# Patient Record
Sex: Male | Born: 1995 | Race: Black or African American | Hispanic: No | Marital: Single | Smoking: Current every day smoker
Health system: Southern US, Community
[De-identification: ages and names within clinical notes are randomized; demographics above are authoritative.]

## PROBLEM LIST (undated history)

## (undated) DIAGNOSIS — F1910 Other psychoactive substance abuse, uncomplicated: Secondary | ICD-10-CM

---
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
## 2015-03-15 ENCOUNTER — Encounter (HOSPITAL_COMMUNITY): Payer: Self-pay | Admitting: Emergency Medicine

## 2015-03-15 ENCOUNTER — Emergency Department (HOSPITAL_COMMUNITY)
Admission: EM | Admit: 2015-03-15 | Discharge: 2015-03-16 | Discharge status: Against Medical Advice | Payer: Medicaid Other | Attending: Emergency Medicine | Admitting: Emergency Medicine

## 2015-03-15 DIAGNOSIS — S0181XA Laceration without foreign body of other part of head, initial encounter: Secondary | ICD-10-CM | POA: Diagnosis not present

## 2015-03-15 DIAGNOSIS — Z720 Tobacco use: Secondary | ICD-10-CM | POA: Diagnosis not present

## 2015-03-15 DIAGNOSIS — X58XXXA Exposure to other specified factors, initial encounter: Secondary | ICD-10-CM | POA: Insufficient documentation

## 2015-03-15 DIAGNOSIS — S0990XA Unspecified injury of head, initial encounter: Secondary | ICD-10-CM | POA: Diagnosis present

## 2015-03-15 DIAGNOSIS — Y929 Unspecified place or not applicable: Secondary | ICD-10-CM | POA: Diagnosis not present

## 2015-03-15 DIAGNOSIS — Y999 Unspecified external cause status: Secondary | ICD-10-CM | POA: Insufficient documentation

## 2015-03-15 DIAGNOSIS — Y939 Activity, unspecified: Secondary | ICD-10-CM | POA: Diagnosis not present

## 2015-03-15 NOTE — ED Notes (Signed)
Pt seen standing in the hallway on phone. When this RN went into the room patient was no longer in the room.
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
## 2016-06-08 DIAGNOSIS — S0240CA Maxillary fracture, right side, initial encounter for closed fracture: Secondary | ICD-10-CM | POA: Diagnosis not present

## 2016-06-08 DIAGNOSIS — W228XXA Striking against or struck by other objects, initial encounter: Secondary | ICD-10-CM | POA: Insufficient documentation

## 2016-06-08 DIAGNOSIS — Y999 Unspecified external cause status: Secondary | ICD-10-CM | POA: Diagnosis not present

## 2016-06-08 DIAGNOSIS — Y939 Activity, unspecified: Secondary | ICD-10-CM | POA: Diagnosis not present

## 2016-06-08 DIAGNOSIS — Z23 Encounter for immunization: Secondary | ICD-10-CM | POA: Diagnosis not present

## 2016-06-08 DIAGNOSIS — S01511A Laceration without foreign body of lip, initial encounter: Secondary | ICD-10-CM | POA: Diagnosis not present

## 2016-06-08 DIAGNOSIS — Z79899 Other long term (current) drug therapy: Secondary | ICD-10-CM | POA: Diagnosis not present

## 2016-06-08 DIAGNOSIS — S0990XA Unspecified injury of head, initial encounter: Secondary | ICD-10-CM | POA: Diagnosis present

## 2016-06-08 DIAGNOSIS — Y929 Unspecified place or not applicable: Secondary | ICD-10-CM | POA: Insufficient documentation

## 2016-06-08 DIAGNOSIS — F17200 Nicotine dependence, unspecified, uncomplicated: Secondary | ICD-10-CM | POA: Insufficient documentation

## 2016-06-09 ENCOUNTER — Emergency Department (HOSPITAL_COMMUNITY): Payer: Medicaid Other

## 2016-06-09 ENCOUNTER — Encounter (HOSPITAL_COMMUNITY): Payer: Self-pay

## 2016-06-09 ENCOUNTER — Emergency Department (HOSPITAL_COMMUNITY)
Admission: EM | Admit: 2016-06-09 | Discharge: 2016-06-09 | Disposition: A | Discharge status: Home / Self Care | Payer: Medicaid Other | Attending: Emergency Medicine | Admitting: Emergency Medicine

## 2016-06-09 DIAGNOSIS — S0240CA Maxillary fracture, right side, initial encounter for closed fracture: Secondary | ICD-10-CM

## 2016-06-09 DIAGNOSIS — S01511A Laceration without foreign body of lip, initial encounter: Secondary | ICD-10-CM

## 2016-06-09 DIAGNOSIS — S0993XA Unspecified injury of face, initial encounter: Secondary | ICD-10-CM

## 2016-06-09 MED ORDER — TETANUS-DIPHTH-ACELL PERTUSSIS 5-2.5-18.5 LF-MCG/0.5 IM SUSP
0.5000 mL | Freq: Once | INTRAMUSCULAR | Status: AC
  Administered 2016-06-09: 0.5 mL via INTRAMUSCULAR
  Filled 2016-06-09: qty 0.5

## 2016-06-09 MED ORDER — HYDROCODONE-ACETAMINOPHEN 5-325 MG PO TABS: 1.0000 | ORAL_TABLET | ORAL | 0 refills | Status: DC | PRN

## 2016-06-09 MED ORDER — LIDOCAINE-EPINEPHRINE-TETRACAINE (LET) SOLUTION
3.0000 mL | Freq: Once | NASAL | Status: AC
  Administered 2016-06-09: 3 mL via TOPICAL
  Filled 2016-06-09: qty 3

## 2016-06-09 MED ORDER — OXYCODONE-ACETAMINOPHEN 5-325 MG PO TABS
1.0000 | ORAL_TABLET | Freq: Once | ORAL | Status: AC
Start: 2016-06-09 — End: 2016-06-09
  Administered 2016-06-09: 1 via ORAL
  Filled 2016-06-09: qty 1

## 2016-06-09 MED ORDER — CEPHALEXIN 500 MG PO CAPS: 500.0000 mg | ORAL_CAPSULE | Freq: Four times a day (QID) | ORAL | 0 refills | Status: DC
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
## 2016-06-09 NOTE — ED Notes (Signed)
Patient stated that took tylenol x1 hour ago.

## 2016-06-09 NOTE — ED Notes (Signed)
Ice bag given for facial edema

## 2016-06-09 NOTE — ED Notes (Signed)
Provider in room  
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
## 2016-06-09 NOTE — ED Notes (Signed)
In ct  

## 2020-12-20 ENCOUNTER — Emergency Department (HOSPITAL_COMMUNITY)
Admission: EM | Admit: 2020-12-20 | Discharge: 2020-12-20 | Disposition: A | Discharge status: Home / Self Care | Payer: Medicaid Other | Attending: Emergency Medicine | Admitting: Emergency Medicine

## 2020-12-20 ENCOUNTER — Other Ambulatory Visit: Payer: Self-pay

## 2020-12-20 DIAGNOSIS — S61412A Laceration without foreign body of left hand, initial encounter: Secondary | ICD-10-CM | POA: Diagnosis not present

## 2020-12-20 DIAGNOSIS — M25532 Pain in left wrist: Secondary | ICD-10-CM | POA: Insufficient documentation

## 2020-12-20 DIAGNOSIS — S6992XA Unspecified injury of left wrist, hand and finger(s), initial encounter: Secondary | ICD-10-CM | POA: Diagnosis present

## 2020-12-20 DIAGNOSIS — W010XXA Fall on same level from slipping, tripping and stumbling without subsequent striking against object, initial encounter: Secondary | ICD-10-CM | POA: Diagnosis not present

## 2020-12-20 NOTE — ED Triage Notes (Signed)
Patient reports he slipped and fell tonight, denies hitting head or LOC, pain to L wrist and lacerations to L hand.

## 2020-12-20 NOTE — ED Notes (Signed)
During triage process, patient stated, "are you gonna give me pain medications or not? I am not going to sit out here for 8 hours." Patient informed that he was being triaged and we needed to check him in so he can be seen. Patient said, "F this, I am leaving. Patient alert and oriented, ambulated with steady gait out of department.

## 2020-12-21 ENCOUNTER — Other Ambulatory Visit: Payer: Self-pay

## 2020-12-21 ENCOUNTER — Encounter (HOSPITAL_COMMUNITY): Payer: Self-pay

## 2020-12-21 ENCOUNTER — Ambulatory Visit (HOSPITAL_COMMUNITY)
Admission: EM | Admit: 2020-12-21 | Discharge: 2020-12-21 | Disposition: A | Discharge status: Home / Self Care | Payer: Medicaid Other | Attending: Urgent Care | Admitting: Urgent Care

## 2020-12-21 ENCOUNTER — Ambulatory Visit (INDEPENDENT_AMBULATORY_CARE_PROVIDER_SITE_OTHER): Payer: Medicaid Other

## 2020-12-21 DIAGNOSIS — S62613B Displaced fracture of proximal phalanx of left middle finger, initial encounter for open fracture: Secondary | ICD-10-CM

## 2020-12-21 DIAGNOSIS — M25532 Pain in left wrist: Secondary | ICD-10-CM | POA: Diagnosis not present

## 2020-12-21 DIAGNOSIS — S60512A Abrasion of left hand, initial encounter: Secondary | ICD-10-CM | POA: Diagnosis not present

## 2020-12-21 DIAGNOSIS — M79642 Pain in left hand: Secondary | ICD-10-CM | POA: Diagnosis not present

## 2020-12-21 DIAGNOSIS — W2209XA Striking against other stationary object, initial encounter: Secondary | ICD-10-CM

## 2020-12-21 DIAGNOSIS — S60212A Contusion of left wrist, initial encounter: Secondary | ICD-10-CM

## 2020-12-21 DIAGNOSIS — M7989 Other specified soft tissue disorders: Secondary | ICD-10-CM

## 2020-12-21 MED ORDER — HYDROCODONE-ACETAMINOPHEN 5-325 MG PO TABS
1.0000 | ORAL_TABLET | Freq: Once | ORAL | Status: AC
  Administered 2020-12-21: 1 via ORAL

## 2020-12-21 MED ORDER — HYDROCODONE-ACETAMINOPHEN 5-325 MG PO TABS
ORAL_TABLET | ORAL | Status: AC
Start: 2020-12-21 — End: ?
  Filled 2020-12-21: qty 1

## 2020-12-21 MED ORDER — CEPHALEXIN 500 MG PO CAPS: 500.0000 mg | ORAL_CAPSULE | Freq: Three times a day (TID) | ORAL | 0 refills | Status: DC

## 2020-12-21 MED ORDER — NAPROXEN 500 MG PO TABS: 500.0000 mg | ORAL_TABLET | Freq: Two times a day (BID) | ORAL | 0 refills | Status: DC

## 2020-12-21 MED ORDER — HYDROCODONE-ACETAMINOPHEN 5-325 MG PO TABS: 1.0000 | ORAL_TABLET | Freq: Four times a day (QID) | ORAL | 0 refills | Status: DC | PRN
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
## 2020-12-21 NOTE — Discharge Instructions (Addendum)
Please contact Dr. Aundria Rud office for an appointment regarding further management of your finger fracture. Keep the buddy tape on but change it after you shower. Start the antibiotic for infection. Please schedule naproxen twice daily with food for your severe pain.  If you still have pain despite taking naproxen regularly, this is breakthrough pain.  You can use hydrocodone, a narcotic pain medicine, once every 4-6 hours for this.  Once your pain is better controlled, switch back to just naproxen.

## 2020-12-21 NOTE — ED Provider Notes (Signed)
[facility]   MRN: [id number] DOB: 31-Mar-1996  Subjective:   Ardis Hughs. is a 25 y.o. male presenting for 1 day history of acute onset left hand, wrist pain with swelling after he punched a wall yesterday ~24 hours ago. Pain is radiating into his left forearm. He is not able to move his fingers due to the pain.  Denies loss sensation, discoloration.  Has used over-the-counter pain medication without any relief.  Last Tdap was 06/09/2016.  No current facility-administered medications for this encounter.  Current Outpatient Medications:  .  cephALEXin (KEFLEX) 500 MG capsule, Take 1 capsule (500 mg total) by mouth 4 (four) times daily., Disp: 28 capsule, Rfl: 0 .  DM-Doxylamine-Acetaminophen 15-6.25-325 MG/15ML LIQD, Take 30 mLs by mouth at bedtime as needed (for cold symptoms.)., Disp: , Rfl:  .  HYDROcodone-acetaminophen (NORCO/VICODIN) 5-325 MG tablet, Take 1-2 tablets by mouth every 4 (four) hours as needed., Disp: 12 tablet, Rfl: 0 .  ibuprofen (ADVIL,MOTRIN) 600 MG tablet, Take 1 tablet (600 mg total) by mouth every 6 (six) hours as needed for pain. (Patient not taking: Reported on 10/16/2014), Disp: 30 tablet, Rfl: 0   Allergies  Allergen Reactions  . Food     Seafood-Lips swells    History reviewed. No pertinent past medical history.   History reviewed. No pertinent surgical history.  History reviewed. No pertinent family history.  Social History   Tobacco Use  . Smoking status: Current Every Day Smoker  . Smokeless tobacco: Never Used  Substance Use Topics  . Alcohol use: Yes  . Drug use: No    ROS   Objective:   Vitals: BP (!) 148/81 (BP Location: Right Arm)   Pulse 64   Temp 98.1 F (36.7 C) (Oral)   Resp 18   SpO2 99%   Physical Exam Constitutional:      General: He is not in acute distress.    Appearance: Normal appearance. He is well-developed and normal weight. He is not ill-appearing, toxic-appearing or diaphoretic.  HENT:      Head: Normocephalic and atraumatic.     Right Ear: External ear normal.     Left Ear: External ear normal.     Nose: Nose normal.     Mouth/Throat:     Pharynx: Oropharynx is clear.  Eyes:     General: No scleral icterus.       Right eye: No discharge.        Left eye: No discharge.     Extraocular Movements: Extraocular movements intact.     Pupils: Pupils are equal, round, and reactive to light.  Cardiovascular:     Rate and Rhythm: Normal rate.  Pulmonary:     Effort: Pulmonary effort is normal.  Musculoskeletal:       Hands:     Cervical back: Normal range of motion.     Comments: Patient with significant guarding of the left arm secondary to his pain.  2+ swelling of the left hand over dorsal mid to ulnar side.  Bony deformity of the left third middle phalanx approximately.  Limited range of motion.  Sensation intact.  Radial pulse 2+.  Neurological:     Mental Status: He is alert and oriented to person, place, and time.  Psychiatric:        Mood and Affect: Mood normal.        Behavior: Behavior normal.        Thought Content: Thought content normal.  Judgment: Judgment normal.     DG Wrist Complete Left  Result Date: 12/21/2020 CLINICAL DATA:  Left hand and wrist pain after punching a brick wall EXAM: LEFT WRIST - COMPLETE 3+ VIEW COMPARISON:  None. FINDINGS: No fracture or dislocation. No aggressive osseous lesion. Severe soft tissue swelling along the dorsal aspect of the hand with associated soft tissue emphysema at the level of the third MCP joint. IMPRESSION: No acute osseous injury of the left wrist. Electronically Signed   By: Elige Ko   On: 12/21/2020 12:45   DG Hand Complete Left  Result Date: 12/21/2020 CLINICAL DATA:  Left hand pain.  Injured punching a brick wall EXAM: LEFT HAND - COMPLETE 3+ VIEW COMPARISON:  None. FINDINGS: Comminuted and mildly impacted fracture of the base of the third proximal phalanx extending to the articular surface of the  MCP joint. No other fracture or dislocation. No aggressive osseous lesion. Severe soft tissue swelling along the dorsal aspect of the hand with associated soft tissue emphysema. IMPRESSION: Comminuted and mildly impacted fracture of the base of the third proximal phalanx extending to the articular surface of the MCP joint. Severe soft tissue swelling along the dorsal aspect of the hand with associated soft tissue emphysema. Electronically Signed   By: Elige Ko   On: 12/21/2020 12:44    Wound cleansed with a Betadine soak. Cleansed gently with soap and water as much as patient would allow.     Assessment and Plan :   I have reviewed the PDMP during this encounter.  1. Open displaced fracture of proximal phalanx of left middle finger, initial encounter   2. Left wrist pain   3. Left hand pain   4. Swelling of left hand   5. Contusion of left wrist, initial encounter   6. Abrasion of left hand, initial encounter     Discussed case with Dr. Aundria Rud, hand specialist on-call.  Provided wound care as recommended with Betadine cleanse.  We will buddy tape the third and fourth fingers, start Keflex 3 times daily for 7 days as recommended by Dr. Aundria Rud.  Naproxen for pain control, hydrocodone for breakthrough pain.  Follow-up with Dr. Aundria Rud office this week. Counseled patient on potential for adverse effects with medications prescribed/recommended today, ER and return-to-clinic precautions discussed, patient verbalized understanding.    Wallis Bamberg, PA-C 12/21/20 1504

## 2021-05-08 ENCOUNTER — Ambulatory Visit (HOSPITAL_COMMUNITY)
Admission: EM | Admit: 2021-05-08 | Discharge: 2021-05-08 | Disposition: A | Discharge status: Home / Self Care | Payer: Medicaid Other | Attending: Emergency Medicine | Admitting: Emergency Medicine

## 2021-05-08 ENCOUNTER — Encounter (HOSPITAL_COMMUNITY): Payer: Self-pay | Admitting: Emergency Medicine

## 2021-05-08 ENCOUNTER — Other Ambulatory Visit: Payer: Self-pay

## 2021-05-08 DIAGNOSIS — Z202 Contact with and (suspected) exposure to infections with a predominantly sexual mode of transmission: Secondary | ICD-10-CM

## 2021-05-08 MED ORDER — METRONIDAZOLE 500 MG PO TABS: 2000.0000 mg | ORAL_TABLET | Freq: Two times a day (BID) | ORAL | 0 refills | Status: AC

## 2021-05-08 NOTE — ED Provider Notes (Signed)
[facility]    CSN: [id number] Arrival date & time: 05/08/21  0856      History   Chief Complaint Chief Complaint  Patient presents with   Exposure to STD    HPI Michael [name]. is a 25 y.o. male.   Patient states that yesterday his girlfriend advised him she tested positive for trichomonas.  They have been having intercourse without using condoms.  Endorses itching at his urethral opening denies genital lesions, burning with urination, inguinal swelling or pain, scrotal swelling or pain, fevers, aches, chills.  Patient states he is only have 1 partner in the past 3 months, denies history of STD infection  The history is provided by the patient.  Exposure to STD   History reviewed. No pertinent past medical history.  There are no problems to display for this patient.   History reviewed. No pertinent surgical history.     Home Medications    Prior to Admission medications   Medication Sig Start Date End Date Taking? Authorizing Provider  metroNIDAZOLE (FLAGYL) 500 MG tablet Take 4 tablets (2,000 mg total) by mouth 2 (two) times daily for 1 dose. 05/08/21 05/09/21 Yes Theadora Rama Scales, PA-C  cephALEXin (KEFLEX) 500 MG capsule Take 1 capsule (500 mg total) by mouth 3 (three) times daily. 12/21/20   Wallis Bamberg, PA-C  DM-Doxylamine-Acetaminophen 15-6.25-325 MG/15ML LIQD Take 30 mLs by mouth at bedtime as needed (for cold symptoms.).    [provider]  HYDROcodone-acetaminophen (NORCO/VICODIN) 5-325 MG tablet Take 1 tablet by mouth every 6 (six) hours as needed for severe pain. 12/21/20   Wallis Bamberg, PA-C  ibuprofen (ADVIL,MOTRIN) 600 MG tablet Take 1 tablet (600 mg total) by mouth every 6 (six) hours as needed for pain. Patient not taking: Reported on 10/16/2014 03/23/13   Marcellina Millin, MD  naproxen (NAPROSYN) 500 MG tablet Take 1 tablet (500 mg total) by mouth 2 (two) times daily with a meal. 12/21/20   Wallis Bamberg, PA-C    Family History No family  history on file.  Social History Social History   Tobacco Use   Smoking status: Every Day   Smokeless tobacco: Never  Substance Use Topics   Alcohol use: Yes   Drug use: No     Allergies   Food   Review of Systems Review of Systems Per HPI  Physical Exam Triage Vital Signs ED Triage Vitals  Enc Vitals Group     BP 05/08/21 0940 138/88     Pulse Rate 05/08/21 0940 64     Resp 05/08/21 0947 15     Temp 05/08/21 0940 98.7 F (37.1 C)     Temp Source 05/08/21 0940 Oral     SpO2 05/08/21 0940 98 %     Weight --      Height --      Head Circumference --      Peak Flow --      Pain Score 05/08/21 0946 0     Pain Loc --      Pain Edu? --      Excl. in GC? --    No data found.  Updated Vital Signs BP 138/88 (BP Location: Right Arm)   Pulse 64   Temp 98.7 F (37.1 C) (Oral)   Resp 15   SpO2 98%   Visual Acuity Right Eye Distance:   Left Eye Distance:   Bilateral Distance:    Right Eye Near:   Left Eye Near:  Bilateral Near:     Physical Exam Constitutional:      Appearance: Normal appearance.  HENT:     Head: Normocephalic and atraumatic.  Cardiovascular:     Rate and Rhythm: Normal rate and regular rhythm.     Heart sounds: Normal heart sounds.  Pulmonary:     Effort: Pulmonary effort is normal.     Breath sounds: Normal breath sounds.  Abdominal:     General: Abdomen is flat. Bowel sounds are normal.     Palpations: Abdomen is soft.  Genitourinary:    Comments: Patient politely declines GU exam today.  Patient did provide [place] with a urethral swab. Musculoskeletal:        General: Normal range of motion.  Skin:    General: Skin is warm and dry.  Neurological:     General: No focal deficit present.     Mental Status: He is alert and oriented to person, place, and time.  Psychiatric:        Mood and Affect: Mood normal.        Behavior: Behavior normal.     UC Treatments / Results  Labs (all labs ordered are listed, but only abnormal  results are displayed) Labs Reviewed  CYTOLOGY, (ORAL, ANAL, URETHRAL) ANCILLARY ONLY    EKG   Radiology No results found.  Procedures Procedures (including critical care time)  Medications Ordered in UC Medications - No data to display  Initial Impression / Assessment and Plan / UC Course  I have reviewed the triage vital signs and the nursing notes.  Pertinent labs & imaging results that were available during my care of the patient were reviewed by me and considered in my medical decision making (see chart for details).     Patient politely declined screening for HIV and syphilis at this time.  Advised he will be notified of the results of his urethral swab once they are available.  Based on known exposure, I will treat patient empirically for trichomonas infection with metronidazole 2 g once.  Patient verbalized understanding and agreement with plan.  All questions were addressed. Final Clinical Impressions(s) / UC Diagnoses   Final diagnoses:  Exposure to trichomonas     Discharge Instructions      You have been exposed to trichomonas infection.  You will be treated with metronidazole.  When you pick up your prescription at the pharmacy there will be 4 tablets, please take them all at 1 time.  There are no further treatment recommendations other than to use condoms when engaging in sexual intercourse.  The culture that you provided for [place] today will be sent to the lab and should be resulted in approximately 3 to 5 days.  You will be notified of those results.  If there is any other treatment needed, that will be provided for you.     ED Prescriptions     Medication Sig Dispense Auth. Provider   metroNIDAZOLE (FLAGYL) 500 MG tablet Take 4 tablets (2,000 mg total) by mouth 2 (two) times daily for 1 dose. 4 tablet Theadora Rama Scales, PA-C      PDMP not reviewed this encounter.   Theadora Rama Scales, PA-C 05/08/21 1021

## 2021-05-08 NOTE — ED Triage Notes (Signed)
Pt reports girlfriend was here yesterday being seen and positive for Trich. So he needs to be treated. Denies s/s.

## 2021-05-08 NOTE — Discharge Instructions (Addendum)
You have been exposed to trichomonas infection.  You will be treated with metronidazole.  When you pick up your prescription at the pharmacy there will be 4 tablets, please take them all at 1 time.  There are no further treatment recommendations other than to use condoms when engaging in sexual intercourse.  The culture that you provided for [place] today will be sent to the lab and should be resulted in approximately 3 to 5 days.  You will be notified of those results.  If there is any other treatment needed, that will be provided for you.

## 2021-05-11 LAB — CYTOLOGY, (ORAL, ANAL, URETHRAL) ANCILLARY ONLY
Chlamydia: NEGATIVE
Comment: NEGATIVE
Comment: NEGATIVE
Comment: NORMAL
Neisseria Gonorrhea: NEGATIVE
Trichomonas: NEGATIVE

## 2021-08-30 ENCOUNTER — Other Ambulatory Visit: Payer: Self-pay

## 2021-08-30 ENCOUNTER — Ambulatory Visit (HOSPITAL_COMMUNITY)
Admission: EM | Admit: 2021-08-30 | Discharge: 2021-08-30 | Disposition: A | Discharge status: Home / Self Care | Payer: Medicaid Other | Attending: Urgent Care | Admitting: Urgent Care

## 2021-08-30 ENCOUNTER — Encounter (HOSPITAL_COMMUNITY): Payer: Self-pay

## 2021-08-30 DIAGNOSIS — L299 Pruritus, unspecified: Secondary | ICD-10-CM | POA: Diagnosis present

## 2021-08-30 DIAGNOSIS — Z202 Contact with and (suspected) exposure to infections with a predominantly sexual mode of transmission: Secondary | ICD-10-CM | POA: Diagnosis present

## 2021-08-30 DIAGNOSIS — Z7251 High risk heterosexual behavior: Secondary | ICD-10-CM | POA: Insufficient documentation

## 2021-08-30 MED ORDER — KETOCONAZOLE 2 % EX CREA: 1.0000 "application " | TOPICAL_CREAM | Freq: Every day | CUTANEOUS | 0 refills | Status: DC

## 2021-08-30 NOTE — ED Triage Notes (Signed)
Pt presents with some penile itching for the past few days.

## 2021-08-30 NOTE — ED Provider Notes (Signed)
Michael [name]   MRN: [id number] DOB: Nov 12, 1995  Subjective:   Michael [name]. is a 26 y.o. male presenting for an STI check.  Patient is sexually active with multiple male partners, does not use condoms for protection.  States that he has itching with one of his partners during sex and afterwards.  Both of them are being seen today in clinic.  She does have a history of trichomonas, was treated in September 2022.  Patient was supposed to get treated but there was miscommunication about the antibiotics.  That he was told that his test was negative so he never took the medication. Denies dysuria, hematuria, urinary frequency, penile discharge, penile swelling, testicular pain, testicular swelling, anal pain, groin pain.   No current facility-administered medications for this encounter.  Current Outpatient Medications:    cephALEXin (KEFLEX) 500 MG capsule, Take 1 capsule (500 mg total) by mouth 3 (three) times daily., Disp: 21 capsule, Rfl: 0   DM-Doxylamine-Acetaminophen 15-6.25-325 MG/15ML LIQD, Take 30 mLs by mouth at bedtime as needed (for cold symptoms.)., Disp: , Rfl:    HYDROcodone-acetaminophen (NORCO/VICODIN) 5-325 MG tablet, Take 1 tablet by mouth every 6 (six) hours as needed for severe pain., Disp: 10 tablet, Rfl: 0   ibuprofen (ADVIL,MOTRIN) 600 MG tablet, Take 1 tablet (600 mg total) by mouth every 6 (six) hours as needed for pain. (Patient not taking: Reported on 10/16/2014), Disp: 30 tablet, Rfl: 0   naproxen (NAPROSYN) 500 MG tablet, Take 1 tablet (500 mg total) by mouth 2 (two) times daily with a meal., Disp: 30 tablet, Rfl: 0   Allergies  Allergen Reactions   Food     Seafood-Lips swells    History reviewed. No pertinent past medical history.   History reviewed. No pertinent surgical history.  History reviewed. No pertinent family history.  Social History   Tobacco Use   Smoking status: Every Day   Smokeless tobacco: Never  Substance Use  Topics   Alcohol use: Yes   Drug use: No    ROS   Objective:   Vitals: BP 124/83 (BP Location: Left Arm)    Pulse 61    Temp 98.4 F (36.9 C) (Oral)    Resp 18    SpO2 100%   Physical Exam Constitutional:      General: He is not in acute distress.    Appearance: Normal appearance. He is well-developed and normal weight. He is not ill-appearing, toxic-appearing or diaphoretic.  HENT:     Head: Normocephalic and atraumatic.     Right Ear: External ear normal.     Left Ear: External ear normal.     Nose: Nose normal.     Mouth/Throat:     Pharynx: Oropharynx is clear.  Eyes:     General: No scleral icterus.       Right eye: No discharge.        Left eye: No discharge.     Extraocular Movements: Extraocular movements intact.     Pupils: Pupils are equal, round, and reactive to light.  Cardiovascular:     Rate and Rhythm: Normal rate.  Pulmonary:     Effort: Pulmonary effort is normal.  Genitourinary:    Penis: Circumcised. No phimosis, paraphimosis, hypospadias, erythema, tenderness, discharge, swelling or lesions.   Musculoskeletal:     Cervical back: Normal range of motion.  Neurological:     Mental Status: He is alert and oriented to person, place, and time.  Psychiatric:  Mood and Affect: Mood normal.        Behavior: Behavior normal.        Thought Content: Thought content normal.        Judgment: Judgment normal.    Assessment and Plan :   PDMP not reviewed this encounter.  1. Unprotected sex   2. Possible exposure to STD   3. Itch    Will cover for tinea cruris with ketoconazole.  Patient would like to wait for test results to treat based off of those for an STI.  Recommended abstinence until then. Counseled patient on potential for adverse effects with medications prescribed/recommended today, ER and return-to-clinic precautions discussed, patient verbalized understanding.    Jaynee Eagles, PA-C 08/30/21 1400

## 2021-08-31 LAB — CYTOLOGY, (ORAL, ANAL, URETHRAL) ANCILLARY ONLY
Chlamydia: NEGATIVE
Comment: NEGATIVE
Comment: NEGATIVE
Comment: NORMAL
Neisseria Gonorrhea: NEGATIVE
Trichomonas: POSITIVE — AB

## 2021-09-01 ENCOUNTER — Telehealth (HOSPITAL_COMMUNITY): Payer: Self-pay | Admitting: Emergency Medicine

## 2021-09-01 MED ORDER — METRONIDAZOLE 500 MG PO TABS: 2000.0000 mg | ORAL_TABLET | Freq: Once | ORAL | 0 refills | Status: AC

## 2021-10-14 ENCOUNTER — Emergency Department (HOSPITAL_COMMUNITY)
Admission: EM | Admit: 2021-10-14 | Discharge: 2021-10-14 | Disposition: A | Discharge status: Home / Self Care | Payer: Medicaid Other | Attending: Emergency Medicine | Admitting: Emergency Medicine

## 2021-10-14 ENCOUNTER — Encounter (HOSPITAL_COMMUNITY): Payer: Self-pay | Admitting: Emergency Medicine

## 2021-10-14 ENCOUNTER — Emergency Department (HOSPITAL_COMMUNITY): Payer: Medicaid Other

## 2021-10-14 DIAGNOSIS — D72829 Elevated white blood cell count, unspecified: Secondary | ICD-10-CM | POA: Insufficient documentation

## 2021-10-14 DIAGNOSIS — R0981 Nasal congestion: Secondary | ICD-10-CM | POA: Diagnosis not present

## 2021-10-14 DIAGNOSIS — R109 Unspecified abdominal pain: Secondary | ICD-10-CM | POA: Insufficient documentation

## 2021-10-14 DIAGNOSIS — R197 Diarrhea, unspecified: Secondary | ICD-10-CM | POA: Insufficient documentation

## 2021-10-14 DIAGNOSIS — R112 Nausea with vomiting, unspecified: Secondary | ICD-10-CM | POA: Diagnosis not present

## 2021-10-14 LAB — COMPREHENSIVE METABOLIC PANEL
ALT: 24 U/L (ref 0–44)
AST: 27 U/L (ref 15–41)
Albumin: 4.5 g/dL (ref 3.5–5.0)
Alkaline Phosphatase: 51 U/L (ref 38–126)
Anion gap: 11 (ref 5–15)
BUN: 13 mg/dL (ref 6–20)
CO2: 22 mmol/L (ref 22–32)
Calcium: 9.5 mg/dL (ref 8.9–10.3)
Chloride: 104 mmol/L (ref 98–111)
Creatinine, Ser: 1.02 mg/dL (ref 0.61–1.24)
GFR, Estimated: >60 mL/min (ref >60)
Glucose, Bld: 105 mg/dL — ABNORMAL HIGH (ref 70–99)
Potassium: 3.3 mmol/L — ABNORMAL LOW (ref 3.5–5.1)
Sodium: 137 mmol/L (ref 135–145)
Total Bilirubin: 0.4 mg/dL (ref 0.3–1.2)
Total Protein: 7.4 g/dL (ref 6.5–8.1)

## 2021-10-14 LAB — CBC
HCT: 44.2 % (ref 39.0–52.0)
Hemoglobin: 14.8 g/dL (ref 13.0–17.0)
MCH: 28.2 pg (ref 26.0–34.0)
MCHC: 33.5 g/dL (ref 30.0–36.0)
MCV: 84.4 fL (ref 80.0–100.0)
Platelets: 346 10*3/uL (ref 150–400)
RBC: 5.24 MIL/uL (ref 4.22–5.81)
RDW: 13.9 % (ref 11.5–15.5)
WBC: 20.8 10*3/uL — ABNORMAL HIGH (ref 4.0–10.5)
nRBC: 0 % (ref 0.0–0.2)

## 2021-10-14 LAB — URINALYSIS, MICROSCOPIC (REFLEX)

## 2021-10-14 LAB — URINALYSIS, ROUTINE W REFLEX MICROSCOPIC
Glucose, UA: NEGATIVE mg/dL
Ketones, ur: 15 mg/dL — AB
Leukocytes,Ua: NEGATIVE
Nitrite: NEGATIVE
Protein, ur: 30 mg/dL — AB
Specific Gravity, Urine: >1.03 — ABNORMAL HIGH (ref 1.005–1.030)
pH: 6 (ref 5.0–8.0)

## 2021-10-14 LAB — LIPASE, BLOOD: Lipase: 29 U/L (ref 11–51)

## 2021-10-14 MED ORDER — SODIUM CHLORIDE 0.9 % IV BOLUS
1000.0000 mL | Freq: Once | INTRAVENOUS | Status: AC
  Administered 2021-10-14: 1000 mL via INTRAVENOUS

## 2021-10-14 MED ORDER — ONDANSETRON HCL 4 MG/2ML IJ SOLN
4.0000 mg | Freq: Once | INTRAMUSCULAR | Status: AC
  Administered 2021-10-14: 4 mg via INTRAVENOUS
  Filled 2021-10-14: qty 2

## 2021-10-14 MED ORDER — IOHEXOL 300 MG/ML  SOLN
100.0000 mL | Freq: Once | INTRAMUSCULAR | Status: AC | PRN
  Administered 2021-10-14: 100 mL via INTRAVENOUS

## 2021-10-14 MED ORDER — ONDANSETRON 4 MG PO TBDP: 4.0000 mg | ORAL_TABLET | Freq: Three times a day (TID) | ORAL | 0 refills | Status: DC | PRN

## 2021-10-14 NOTE — Discharge Instructions (Addendum)
You came to the emergency department today to be evaluated for your nausea, vomiting, and diarrhea.  The CT scan of your abdomen and pelvis showed no acute abnormalities.  Due to your nausea and vomiting have given you prescription for Zofran, you may take 1 pill as needed every 4 hours for nausea and vomiting.  Get help right away if: You have pain in your chest, neck, arm, or jaw. You feel extremely weak or you faint. You have persistent vomiting. You have vomit that is bright red or looks like black coffee grounds. You have bloody or black stools (feces) or stools that look like tar. You have a severe headache, a stiff neck, or both. You have severe pain, cramping, or bloating in your abdomen. You have difficulty breathing, or you are breathing very quickly. Your heart is beating very quickly. Your skin feels cold and clammy. You feel confused. You have signs of dehydration, such as: Dark urine, very little urine, or no urine. Cracked lips. Dry mouth. Sunken eyes. Sleepiness. Weakness.

## 2021-10-14 NOTE — ED Provider Notes (Signed)
[facility] EMERGENCY DEPARTMENT Provider Note   CSN: [id number] Arrival date & time: 10/14/21  1107     History  Chief Complaint  Patient presents with   Emesis    Michael [name]. is a 26 y.o. male.  Pt reports he began having vomiting and diarrhea after eating at [facility].    The history is provided by the patient. No language interpreter was used.  Emesis Severity:  Severe Duration:  1 day Quality:  Unable to specify How soon after eating does vomiting occur:  12 hours Progression:  Worsening Chronicity:  New Recent urination:  Normal Relieved by:  Nothing Worsened by:  Nothing Ineffective treatments:  None tried Associated symptoms: abdominal pain   Risk factors: no alcohol use       Home Medications Prior to Admission medications   Medication Sig Start Date End Date Taking? Authorizing Provider  HYDROcodone-acetaminophen (NORCO/VICODIN) 5-325 MG tablet Take 1 tablet by mouth every 6 (six) hours as needed for severe pain. Patient not taking: Reported on 10/14/2021 12/21/20   Wallis Bamberg, PA-C  ibuprofen (ADVIL,MOTRIN) 600 MG tablet Take 1 tablet (600 mg total) by mouth every 6 (six) hours as needed for pain. Patient not taking: Reported on 10/16/2014 03/23/13   Marcellina Millin, MD  ketoconazole (NIZORAL) 2 % cream Apply 1 application topically daily. Patient not taking: Reported on 10/14/2021 08/30/21   Wallis Bamberg, PA-C  naproxen (NAPROSYN) 500 MG tablet Take 1 tablet (500 mg total) by mouth 2 (two) times daily with a meal. Patient not taking: Reported on 10/14/2021 12/21/20   Wallis Bamberg, PA-C      Allergies    Food    Review of Systems   Review of Systems  Gastrointestinal:  Positive for abdominal pain and vomiting.  All other systems reviewed and are negative.  Physical Exam Updated Vital Signs BP (!) 142/81 (BP Location: Right Arm)    Pulse 99    Temp 98 F (36.7 C) (Oral)    Resp 18    SpO2 100%  Physical Exam Vitals and nursing note  reviewed.  Constitutional:      Appearance: He is well-developed.  HENT:     Head: Normocephalic.     Right Ear: External ear normal.     Left Ear: External ear normal.     Nose: Congestion present.     Mouth/Throat:     Mouth: Mucous membranes are moist.  Cardiovascular:     Rate and Rhythm: Normal rate.  Pulmonary:     Effort: Pulmonary effort is normal.  Abdominal:     General: There is no distension.     Palpations: Abdomen is soft.     Tenderness: There is abdominal tenderness.  Musculoskeletal:        General: Normal range of motion.     Cervical back: Normal range of motion.  Neurological:     General: No focal deficit present.     Mental Status: He is alert and oriented to person, place, and time.  Psychiatric:        Mood and Affect: Mood normal.    ED Results / Procedures / Treatments   Labs (all labs ordered are listed, but only abnormal results are displayed) Labs Reviewed  COMPREHENSIVE METABOLIC PANEL - Abnormal; Notable for the following components:      Result Value   Potassium 3.3 (*)    Glucose, Bld 105 (*)    All other components within normal limits  CBC -  Abnormal; Notable for the following components:   WBC 20.8 (*)    All other components within normal limits  URINALYSIS, ROUTINE W REFLEX MICROSCOPIC - Abnormal; Notable for the following components:   Specific Gravity, Urine >1.030 (*)    Hgb urine dipstick MODERATE (*)    Bilirubin Urine SMALL (*)    Ketones, ur 15 (*)    Protein, ur 30 (*)    All other components within normal limits  URINALYSIS, MICROSCOPIC (REFLEX) - Abnormal; Notable for the following components:   Bacteria, UA RARE (*)    All other components within normal limits  LIPASE, BLOOD    EKG None  Radiology No results found.  Procedures Procedures    Medications Ordered in ED Medications  sodium chloride 0.9 % bolus 1,000 mL (0 mLs Intravenous Stopped 10/14/21 1831)  ondansetron (ZOFRAN) injection 4 mg (4 mg  Intravenous Given 10/14/21 1518)    ED Course/ Medical Decision Making/ A&P                           Medical Decision Making Pt reports vomiting and diarrhea after eating at [facility]    Problems Addressed: Nausea vomiting and diarrhea: acute illness or injury  Amount and/or Complexity of Data Reviewed Labs: ordered.    Details: Reviewed labs.  Pt has an elevated WBC count of 20. Radiology: ordered and independent interpretation performed. Decision-making details documented in ED Course.    Details: Ct shows no evidence of appendicitis  Risk Prescription drug management.   MDM:  Pt has an elevated wbc count.  Pt's care turned over to [facility].        Final Clinical Impression(s) / ED Diagnoses Final diagnoses:  Nausea vomiting and diarrhea    Rx / DC Orders ED Discharge Orders          Ordered    ondansetron (ZOFRAN-ODT) 4 MG disintegrating tablet  Every 8 hours PRN        10/14/21 2009              Osie Cheeks 10/15/21 1611    Jacalyn Lefevre, MD 10/16/21 [phone]

## 2021-10-14 NOTE — ED Provider Notes (Signed)
°  Care of patient assumed from [place] at [phone].  Agree with history, physical exam and plan.  See their note for further details. Briefly, patient started having nausea, vomiting, and diarrhea after eating [employer] yesterday evening.  Emesis described as stomach contents and bilious.  Denies any hematemesis or coffee-ground emesis.  No blood in stool or melena.  Physical Exam  BP (!) 142/81 (BP Location: Right Arm)    Pulse 99    Temp 98 F (36.7 C) (Oral)    Resp 18    SpO2 96%   Physical Exam Vitals and nursing note reviewed.  Constitutional:      General: He is not in acute distress.    Appearance: He is not ill-appearing, toxic-appearing or diaphoretic.  HENT:     Head: Normocephalic.  Eyes:     General: No scleral icterus.       Right eye: No discharge.        Left eye: No discharge.  Cardiovascular:     Rate and Rhythm: Normal rate.  Pulmonary:     Effort: Pulmonary effort is normal.  Abdominal:     General: There is no distension. There are no signs of injury.     Palpations: Abdomen is soft. There is no mass or pulsatile mass.     Tenderness: There is no abdominal tenderness. There is no guarding or rebound.  Skin:    General: Skin is warm and dry.  Neurological:     General: No focal deficit present.     Mental Status: He is alert.  Psychiatric:        Behavior: Behavior is cooperative.    Procedures  Procedures  ED Course / MDM    Medical Decision Making Amount and/or Complexity of Data Reviewed Labs: ordered. Radiology: ordered.  Risk Prescription drug management.   At time of handoff CT abdomen pelvis pending.  Lab work was significant for leukocytosis at 20.0.  We will add on chest x-ray to evaluate for possible signs of pneumonia.  CT abdomen pelvis shows fluid distention within the stomach however no other acute abnormalities.  Chest x-ray shows no acute cardiopulmonary disease.  On my examination abdomen soft, nondistended, nontender.   Patient able to handle p.o. intake without difficulty.  Suspect possible viral gastroenteritis patient's cause of symptoms.  Will prescribe patient with Zofran for his nausea and vomiting.  Discussed results, findings, treatment and follow up. Patient advised of return precautions. Patient verbalized understanding and agreed with plan.        Dyann Ruddle 10/14/21 2009    Gareth Morgan, MD 10/15/21 2216

## 2021-10-14 NOTE — ED Notes (Signed)
Patient transported to CT 

## 2021-10-14 NOTE — ED Provider Triage Note (Signed)
Emergency Medicine Provider Triage Evaluation Note  Michael [name]. , a 26 y.o. male  was evaluated in triage.  Pt complains of N/V with diarrhea and abdominal pain since last night.  Believes he may have food poisoning. No recent illness or fever.  No hx of abdominal surgeries.  Review of Systems  Positive: N/V, diarrhea, abdominal pain Negative: fever  Physical Exam  BP (!) 148/87 (BP Location: Right Arm)    Pulse 92    Temp 98 F (36.7 C) (Oral)    Resp 16    SpO2 96%  Gen:   Awake, no distress   Resp:  Normal effort  MSK:   Moves extremities without difficulty  Other:  Periumbilical abdominal tenderness  Medical Decision Making  Medically screening exam initiated at 11:38 AM.  Appropriate orders placed.  Michael [name]. was informed that the remainder of the evaluation will be completed by another provider, this initial triage assessment does not replace that evaluation, and the importance of remaining in the ED until their evaluation is complete.  Labs ordered   Prince Rome, Hershal Coria [id number]

## 2021-10-14 NOTE — ED Triage Notes (Signed)
Patient here with complaint of diarrhea and emesis that started last night. Also reports 4/10 abdominal pain that started around the same time. Patient states he thinks he may have gotten food poisoning from working at [employer]. Patient is alert, oriented, afebrile, and in no apparent distress at this time.

## 2022-08-30 ENCOUNTER — Other Ambulatory Visit: Payer: Self-pay

## 2022-08-30 ENCOUNTER — Ambulatory Visit (HOSPITAL_COMMUNITY)
Admission: EM | Admit: 2022-08-30 | Discharge: 2022-08-30 | Disposition: A | Discharge status: Home / Self Care | Payer: Medicaid Other | Attending: Emergency Medicine | Admitting: Emergency Medicine

## 2022-08-30 ENCOUNTER — Encounter (HOSPITAL_COMMUNITY): Payer: Self-pay | Admitting: *Deleted

## 2022-08-30 DIAGNOSIS — M7918 Myalgia, other site: Secondary | ICD-10-CM

## 2022-08-30 MED ORDER — CEPHALEXIN 500 MG PO CAPS: 500.0000 mg | ORAL_CAPSULE | Freq: Two times a day (BID) | ORAL | 0 refills | Status: DC

## 2022-08-30 MED ORDER — KETOROLAC TROMETHAMINE 30 MG/ML IJ SOLN
INTRAMUSCULAR | Status: AC
  Filled 2022-08-30: qty 1

## 2022-08-30 MED ORDER — NAPROXEN 500 MG PO TABS: 500.0000 mg | ORAL_TABLET | Freq: Two times a day (BID) | ORAL | 0 refills | Status: DC

## 2022-08-30 MED ORDER — KETOROLAC TROMETHAMINE 30 MG/ML IJ SOLN
30.0000 mg | Freq: Once | INTRAMUSCULAR | Status: AC
  Administered 2022-08-30: 30 mg via INTRAMUSCULAR

## 2022-08-30 NOTE — ED Notes (Signed)
At bedside for [facility], NP examination of painful area at base of spine.

## 2022-08-30 NOTE — ED Triage Notes (Signed)
Pt reports lower back pain with out injury. Pt reports he can not sit or bend over to tie his shoe laces.

## 2022-08-30 NOTE — ED Provider Notes (Signed)
[facility]    CSN: [id number] Arrival date & time: 08/30/22  0737      History   Chief Complaint Chief Complaint  Patient presents with   Back Pain    HPI Michael [name]. is a 27 y.o. male. Patient presents complaining of buttock pain that started yesterday.  Patient reports onset of symptoms began while he was walking home from work.  He denies any trauma or fall.  Denies any radiation of pain down any of his lower extremities.  He denies any history of similar symptoms.  He denies any changes to the skin in the location of pain.  He denies any drainage from site. He denies any rectal pain, rectal bleeding, fever, chills, or any systemic symptoms. He reports that pain is worsened when attempting to sit down or with bending.  He has not taken any medications for symptoms.    Back Pain   History reviewed. No pertinent past medical history.  There are no problems to display for this patient.   History reviewed. No pertinent surgical history.     Home Medications    Prior to Admission medications   Medication Sig Start Date End Date Taking? Authorizing Provider  cephALEXin (KEFLEX) 500 MG capsule Take 1 capsule (500 mg total) by mouth 2 (two) times daily. 08/30/22  Yes Flossie Dibble, NP  naproxen (NAPROSYN) 500 MG tablet Take 1 tablet (500 mg total) by mouth 2 (two) times daily. 08/30/22  Yes Flossie Dibble, NP  ondansetron (ZOFRAN-ODT) 4 MG disintegrating tablet Take 1 tablet (4 mg total) by mouth every 8 (eight) hours as needed for nausea or vomiting. 10/14/21   Loni Beckwith, PA-C    Family History History reviewed. No pertinent family history.  Social History Social History   Tobacco Use   Smoking status: Every Day   Smokeless tobacco: Never  Substance Use Topics   Alcohol use: Yes   Drug use: No     Allergies   Food   Review of Systems Review of Systems Per HPI  Physical Exam Triage Vital Signs ED Triage Vitals  Enc  Vitals Group     BP 08/30/22 1155 (!) 143/87     Pulse Rate 08/30/22 1155 74     Resp 08/30/22 1155 18     Temp 08/30/22 1155 97.8 F (36.6 C)     Temp src --      SpO2 08/30/22 1155 96 %     Weight --      Height --      Head Circumference --      Peak Flow --      Pain Score 08/30/22 1153 8     Pain Loc --      Pain Edu? --      Excl. in [place]? --    No data found.  Updated Vital Signs BP (!) 143/87   Pulse 74   Temp 97.8 F (36.6 C)   Resp 18   SpO2 96%       Physical Exam Vitals and nursing note reviewed.  Constitutional:      Appearance: Normal appearance.  Skin:    General: Skin is warm.     Findings: No erythema, rash or wound.          Comments: Pain upon palpation of gluteal cleft. No erythema noted. Fluctuance noted upon palpation with no obvious abcess. No area of drainage noted or no wound.  Warmth upon palpation.  Neurological:     Mental Status: He is alert.      UC Treatments / Results  Labs (all labs ordered are listed, but only abnormal results are displayed) Labs Reviewed - No data to display  EKG   Radiology No results found.  Procedures Procedures (including critical care time)  Medications Ordered in UC Medications  ketorolac (TORADOL) 30 MG/ML injection 30 mg (30 mg Intramuscular Given 08/30/22 1259)    Initial Impression / Assessment and Plan / UC Course  I have reviewed the triage vital signs and the nursing notes.  Pertinent labs & imaging results that were available during my care of the patient were reviewed by me and considered in my medical decision making (see chart for details).     Patient was evaluated for gluteal pain. Toradol injection given in office for pain management. Uncertain etiology of symptoms, may be beginning of abscess formation based on physical assessment.  Antimicrobial coverage prescribed, Keflex, and naproxen sent to pharmacy for pain management.  Patient was made aware to follow-up if symptoms are  not improving.  Patient verbalized understanding of instructions.  Charting was provided using a a verbal dictation system, charting was proofread for errors, errors may occur which could change the meaning of the information charted.   Final Clinical Impressions(s) / UC Diagnoses   Final diagnoses:  Gluteal pain     Discharge Instructions      Naproxen sent to pharmacy for pain management, you may begin taking this tomorrow, you can take this medicine 2 times daily with 12 hours in between each dose.  Cephalexin is an antibiotic, you can take this 2 times daily for the next 5 days.  You may also place a warm compress on the affected area.  Please follow-up if symptoms are not improving.      ED Prescriptions     Medication Sig Dispense Auth. Provider   naproxen (NAPROSYN) 500 MG tablet Take 1 tablet (500 mg total) by mouth 2 (two) times daily. 30 tablet Debby Freiberg, NP   cephALEXin (KEFLEX) 500 MG capsule Take 1 capsule (500 mg total) by mouth 2 (two) times daily. 10 capsule Debby Freiberg, NP      PDMP not reviewed this encounter.   Debby Freiberg, NP 08/30/22 1437

## 2022-08-30 NOTE — Discharge Instructions (Addendum)
Naproxen sent to pharmacy for pain management, you may begin taking this tomorrow, you can take this medicine 2 times daily with 12 hours in between each dose.  Cephalexin is an antibiotic, you can take this 2 times daily for the next 5 days.  You may also place a warm compress on the affected area.  Please follow-up if symptoms are not improving.

## 2023-03-21 DIAGNOSIS — F1920 Other psychoactive substance dependence, uncomplicated: Secondary | ICD-10-CM | POA: Diagnosis not present

## 2023-05-11 ENCOUNTER — Emergency Department (HOSPITAL_COMMUNITY)
Admission: EM | Admit: 2023-05-11 | Discharge: 2023-05-11 | Disposition: A | Discharge status: Home / Self Care | Payer: MEDICAID | Attending: Emergency Medicine | Admitting: Emergency Medicine

## 2023-05-11 ENCOUNTER — Other Ambulatory Visit: Payer: Self-pay

## 2023-05-11 ENCOUNTER — Encounter (HOSPITAL_COMMUNITY): Payer: Self-pay

## 2023-05-11 DIAGNOSIS — T50901A Poisoning by unspecified drugs, medicaments and biological substances, accidental (unintentional), initial encounter: Secondary | ICD-10-CM | POA: Insufficient documentation

## 2023-05-11 DIAGNOSIS — D649 Anemia, unspecified: Secondary | ICD-10-CM | POA: Insufficient documentation

## 2023-05-11 DIAGNOSIS — E8809 Other disorders of plasma-protein metabolism, not elsewhere classified: Secondary | ICD-10-CM | POA: Diagnosis not present

## 2023-05-11 DIAGNOSIS — E871 Hypo-osmolality and hyponatremia: Secondary | ICD-10-CM | POA: Insufficient documentation

## 2023-05-11 LAB — RAPID URINE DRUG SCREEN, HOSP PERFORMED
Amphetamines: NOT DETECTED
Barbiturates: NOT DETECTED
Benzodiazepines: POSITIVE — AB
Cocaine: POSITIVE — AB
Opiates: NOT DETECTED
Tetrahydrocannabinol: POSITIVE — AB

## 2023-05-11 LAB — COMPREHENSIVE METABOLIC PANEL WITH GFR
ALT: 15 U/L (ref 0–44)
AST: 15 U/L (ref 15–41)
Albumin: 2.7 g/dL — ABNORMAL LOW (ref 3.5–5.0)
Alkaline Phosphatase: 27 U/L — ABNORMAL LOW (ref 38–126)
Anion gap: 6 (ref 5–15)
BUN: 12 mg/dL (ref 6–20)
CO2: 23 mmol/L (ref 22–32)
Calcium: 7.8 mg/dL — ABNORMAL LOW (ref 8.9–10.3)
Chloride: 104 mmol/L (ref 98–111)
Creatinine, Ser: 1.33 mg/dL — ABNORMAL HIGH (ref 0.61–1.24)
GFR, Estimated: >60 mL/min (ref >60)
Glucose, Bld: 98 mg/dL (ref 70–99)
Potassium: 3.6 mmol/L (ref 3.5–5.1)
Sodium: 133 mmol/L — ABNORMAL LOW (ref 135–145)
Total Bilirubin: 0.5 mg/dL (ref 0.3–1.2)
Total Protein: 4.8 g/dL — ABNORMAL LOW (ref 6.5–8.1)

## 2023-05-11 LAB — CBC WITH DIFFERENTIAL/PLATELET
Abs Immature Granulocytes: 0.02 10*3/uL (ref 0.00–0.07)
Basophils Absolute: 0 10*3/uL (ref 0.0–0.1)
Basophils Relative: 0 %
Eosinophils Absolute: 0.1 10*3/uL (ref 0.0–0.5)
Eosinophils Relative: 1 %
HCT: 35.4 % — ABNORMAL LOW (ref 39.0–52.0)
Hemoglobin: 11.3 g/dL — ABNORMAL LOW (ref 13.0–17.0)
Immature Granulocytes: 0 %
Lymphocytes Relative: 20 %
Lymphs Abs: 1.9 10*3/uL (ref 0.7–4.0)
MCH: 27.9 pg (ref 26.0–34.0)
MCHC: 31.9 g/dL (ref 30.0–36.0)
MCV: 87.4 fL (ref 80.0–100.0)
Monocytes Absolute: 0.7 10*3/uL (ref 0.1–1.0)
Monocytes Relative: 8 %
Neutro Abs: 6.7 10*3/uL (ref 1.7–7.7)
Neutrophils Relative %: 71 %
Platelets: 276 10*3/uL (ref 150–400)
RBC: 4.05 MIL/uL — ABNORMAL LOW (ref 4.22–5.81)
RDW: 14 % (ref 11.5–15.5)
WBC: 9.4 10*3/uL (ref 4.0–10.5)
nRBC: 0 % (ref 0.0–0.2)

## 2023-05-11 LAB — ETHANOL: Alcohol, Ethyl (B): <10 mg/dL (ref <10)

## 2023-05-11 LAB — ACETAMINOPHEN LEVEL: Acetaminophen (Tylenol), Serum: <10 ug/mL — ABNORMAL LOW (ref 10–30)

## 2023-05-11 LAB — SALICYLATE LEVEL: Salicylate Lvl: <7 mg/dL — ABNORMAL LOW (ref 7.0–30.0)

## 2023-05-11 MED ORDER — LACTATED RINGERS IV BOLUS
1000.0000 mL | Freq: Once | INTRAVENOUS | Status: AC
  Administered 2023-05-11: 1000 mL via INTRAVENOUS

## 2023-05-11 NOTE — ED Provider Notes (Signed)
[place] EMERGENCY DEPARTMENT AT [facility] Provider Note   CSN: [id number] Arrival date & time: 05/11/23  2015     History {Add pertinent medical, surgical, social history, OB history to HPI:1} Chief Complaint  Patient presents with   Altered Mental Status    Michael [name]. is a 27 y.o. male.   Altered Mental Status 27 year old male previously healthy presenting to the ED. patient states earlier today he was smoking a blunt.  [place].  Patient states he wonders if he may have had something else in the blunt.  He denies any intent for self-harm.  He thinks it was just a liter that he intended to take.  He did not fall or hit his head.  No headache, neck pain, chest pain, back pain, abdominal pain.  No weakness or numbness or vision changes.  Denies any other ingestions.     Home Medications Prior to Admission medications   Medication Sig Start Date End Date Taking? Authorizing Provider  cephALEXin (KEFLEX) 500 MG capsule Take 1 capsule (500 mg total) by mouth 2 (two) times daily. 08/30/22   Debby Freiberg, NP  naproxen (NAPROSYN) 500 MG tablet Take 1 tablet (500 mg total) by mouth 2 (two) times daily. 08/30/22   Debby Freiberg, NP  ondansetron (ZOFRAN-ODT) 4 MG disintegrating tablet Take 1 tablet (4 mg total) by mouth every 8 (eight) hours as needed for nausea or vomiting. 10/14/21   Haskel Schroeder, PA-C      Allergies    Food    Review of Systems   Review of Systems Review of systems completed and notable as per HPI.  ROS otherwise negative.   Physical Exam Updated Vital Signs BP (!) 93/55   Pulse (!) 58   Temp 97.8 F (36.6 C) (Oral)   Resp 13   Ht 5\' 7"  (1.702 m)   Wt 74.8 kg   SpO2 99%   BMI 25.83 kg/m  Physical Exam Vitals and nursing note reviewed.  Constitutional:      General: He is not in acute distress.    Appearance: He is well-developed.  HENT:      Head: Normocephalic and atraumatic.     Nose: Nose normal.     Mouth/Throat:     Mouth: Mucous membranes are moist.     Pharynx: Oropharynx is clear.  Eyes:     Extraocular Movements: Extraocular movements intact.     Pupils: Pupils are equal, round, and reactive to light.     Comments: Mild conjunctival erythema  Cardiovascular:     Rate and Rhythm: Normal rate and regular rhythm.     Heart sounds: No murmur heard. Pulmonary:     Effort: Pulmonary effort is normal. No respiratory distress.     Breath sounds: Normal breath sounds.  Abdominal:     Palpations: Abdomen is soft.     Tenderness: There is no abdominal tenderness. There is no guarding or rebound.  Musculoskeletal:        General: No swelling.     Cervical back: Neck supple.     Right lower leg: No edema.     Left lower leg: No edema.  Skin:    General: Skin is warm and dry.     Capillary Refill: Capillary refill takes less than 2 seconds.  Neurological:     General: No focal deficit present.     Mental  Status: He is alert and oriented to person, place, and time. Mental status is at baseline.     Cranial Nerves: No cranial nerve deficit.     Sensory: No sensory deficit.     Motor: No weakness.     Coordination: Coordination normal.  Psychiatric:        Mood and Affect: Mood normal.     ED Results / Procedures / Treatments   Labs (all labs ordered are listed, but only abnormal results are displayed) Labs Reviewed  CBC WITH DIFFERENTIAL/PLATELET  COMPREHENSIVE METABOLIC PANEL  ETHANOL  SALICYLATE LEVEL  ACETAMINOPHEN LEVEL  RAPID URINE DRUG SCREEN, HOSP PERFORMED    EKG None  Radiology No results found.  Procedures Procedures  {Document cardiac monitor, telemetry assessment procedure when appropriate:1}  Medications Ordered in ED Medications - No data to display  ED Course/ Medical Decision Making/ A&P   {   Click here for ABCD2, HEART and other calculatorsREFRESH Note before signing :1}                               Medical Decision Making Amount and/or Complexity of Data Reviewed Labs: ordered.   Medical Decision Making:   Michael [name]. is a 27 y.o. male who presented to the ED today with abnormal behavior after smoking a blunt today.  Vital signs here notable for slightly soft pressure although normotensive on my evaluation.  EKG shows likely normal early repolarization.  Exam now he is awake and alert and appropriately oriented.  Appears somewhat tired.  I suspect he may have ingested marijuana that was contaminated with another substance.  Will plan for observation here, lab workup.  He has no deficits, low concern for stroke.   {crccomplexity:27900} Reviewed and confirmed nursing documentation for past medical history, family history, social history.  Reassessment and Plan:   ***    Patient's presentation is most consistent with {EM COPA:27473}     {Document critical care time when appropriate:1} {Document review of labs and clinical decision tools ie heart score, Chads2Vasc2 etc:1}  {Document your independent review of radiology images, and any outside records:1} {Document your discussion with family members, caretakers, and with consultants:1} {Document social determinants of health affecting pt's care:1} {Document your decision making why or why not admission, treatments were needed:1} Final Clinical Impression(s) / ED Diagnoses Final diagnoses:  None    Rx / DC Orders ED Discharge Orders     None

## 2023-05-11 NOTE — ED Notes (Signed)
Pt requested to leave/ MD at bedside/ pt states he is not waiting for d/c papers

## 2023-05-11 NOTE — Discharge Instructions (Signed)
Follow-up with your primary care provider.

## 2023-05-11 NOTE — ED Triage Notes (Signed)
[place] Pt lit up a blunt, started smoking it and acting strange. Pt was on all 4s when EMS arrived. 92/60, 500 ns given, left ac 18 g. Pts eyes are dilated, pt not oriented.

## 2023-05-29 IMAGING — DX DG CHEST 1V PORT
1 series · 1 of 1 positions shown · non-contrast
Comparison: March 23, 2013

CLINICAL DATA: Vomiting and diarrhea.

EXAM:
PORTABLE CHEST 1 VIEW

[chest ap]
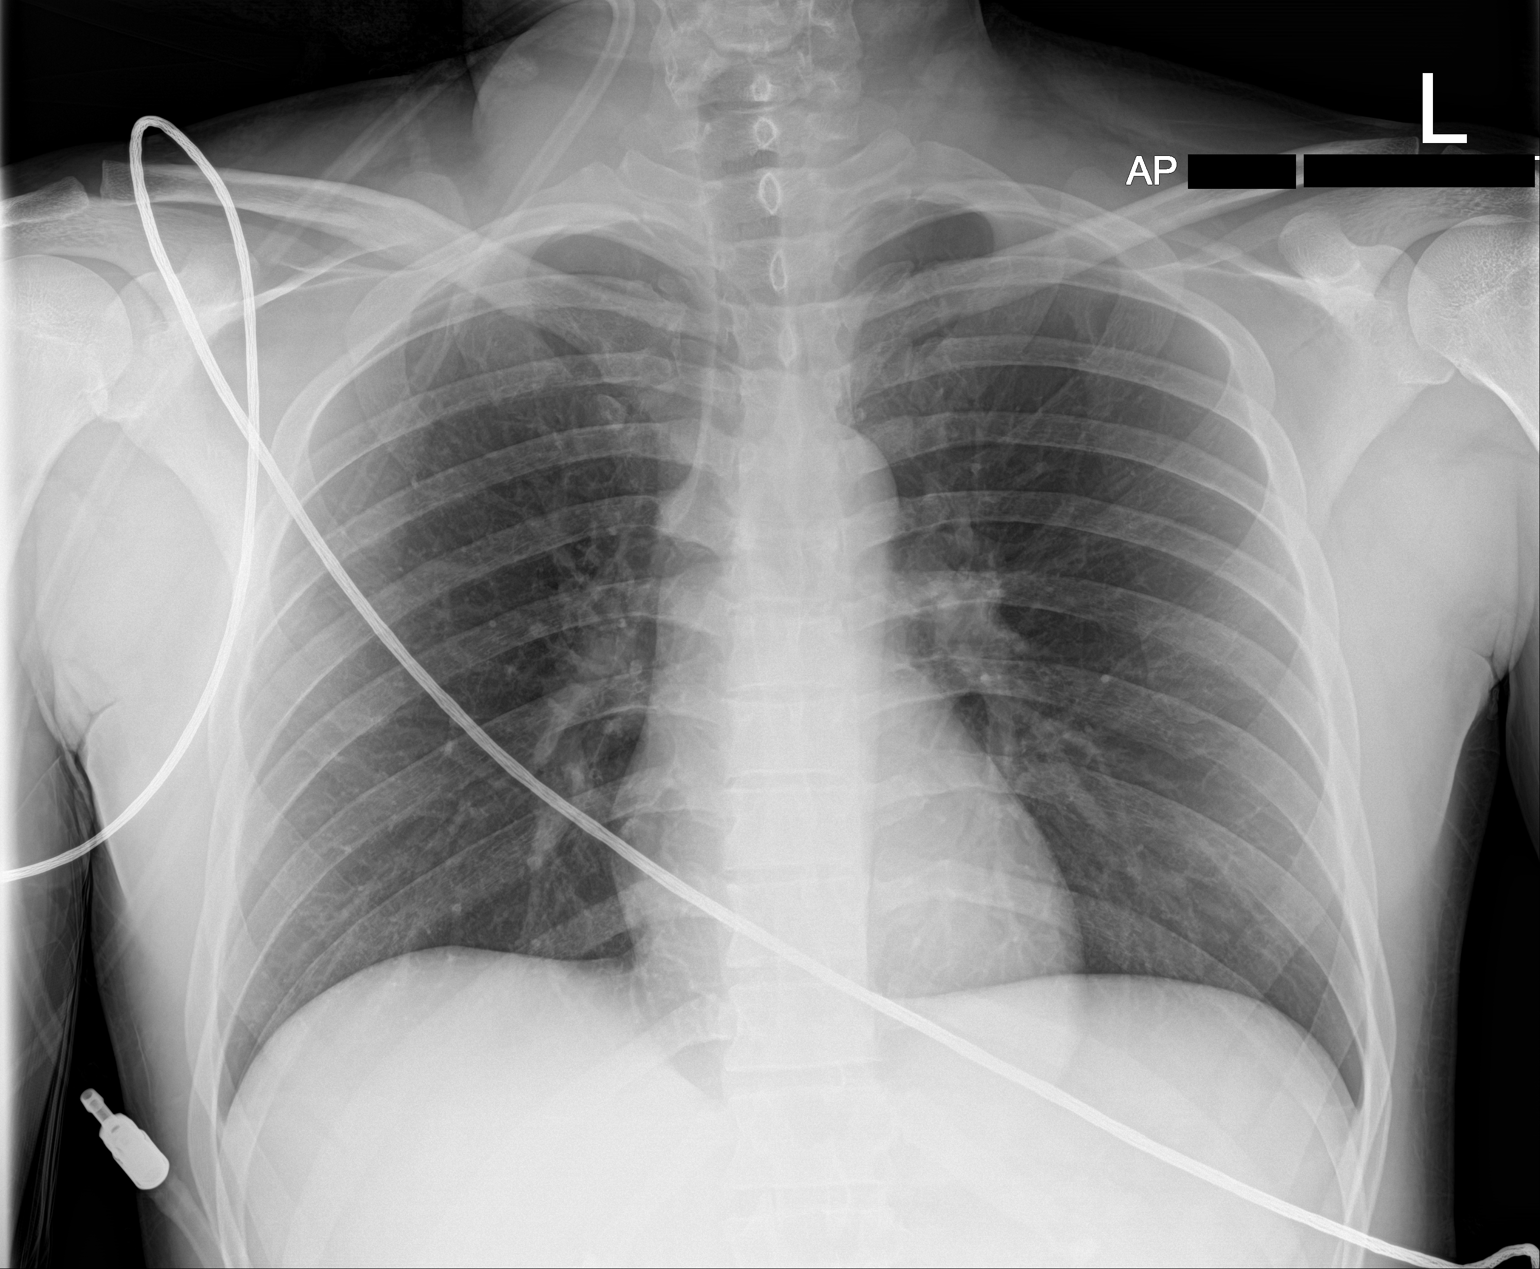

[1 of 1 positions shown; findings below may reference images not displayed]

FINDINGS: The heart size and mediastinal contours are within normal limits.
Both lungs are clear. The visualized skeletal structures are
unremarkable.
IMPRESSION: No active disease.

## 2023-05-29 IMAGING — CT CT ABD-PELV W/ CM
2 of 4 series · 16 of 46 positions shown, 18 images · IV contrast (APPLIED)
Comparison: None.

CLINICAL DATA: Diarrhea emesis pain

EXAM:
CT ABDOMEN AND PELVIS WITH CONTRAST
TECHNIQUE: Multidetector CT imaging of the abdomen and pelvis was performed
using the standard protocol following bolus administration of
intravenous contrast.

[Series 3: abdomen 5.0 · axial · 0.88mm/px · z∈[+887,+1257]mm · 13 of 84 slices shown, 15 images]
[im 5/84  soft-tissue]
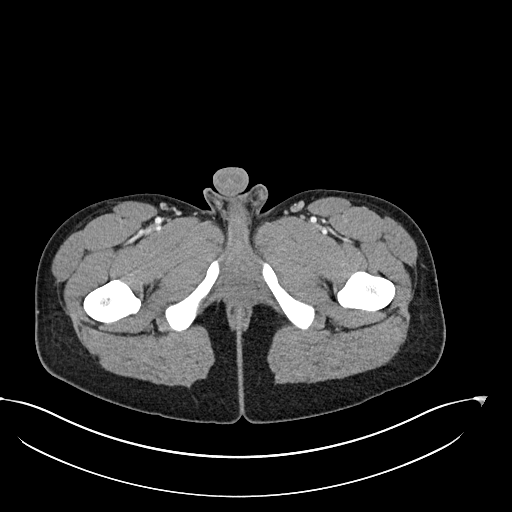
[im 5/84  bone]
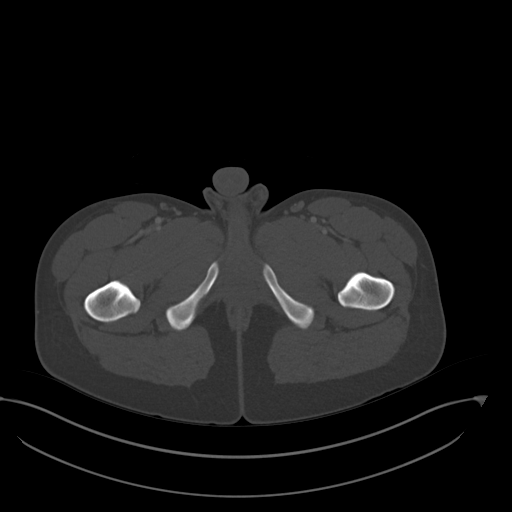
[im 14/84  soft-tissue]
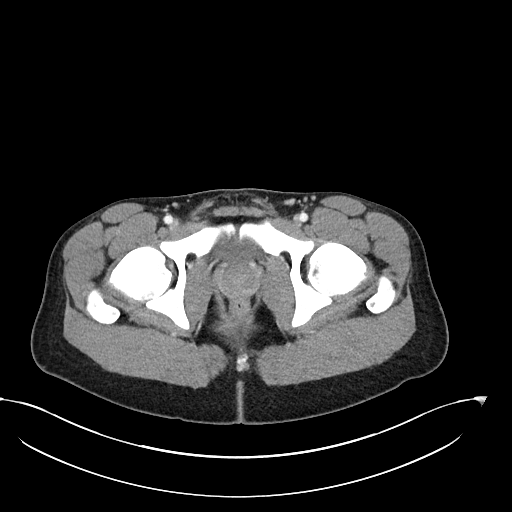
[im 18/84  soft-tissue]
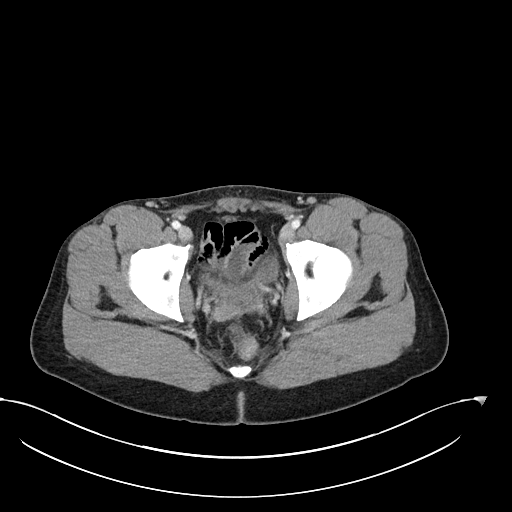
[im 22/84  soft-tissue]
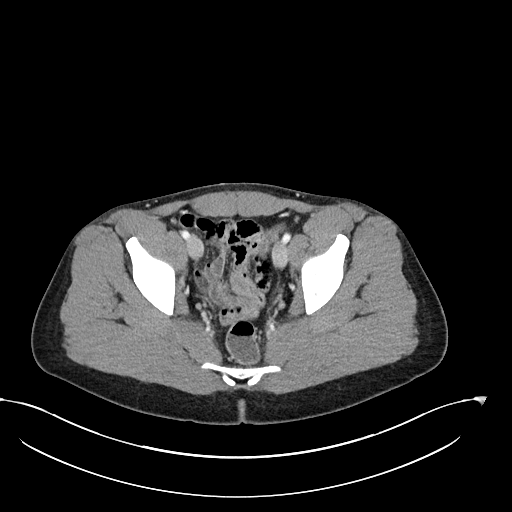
[im 31/84  soft-tissue]
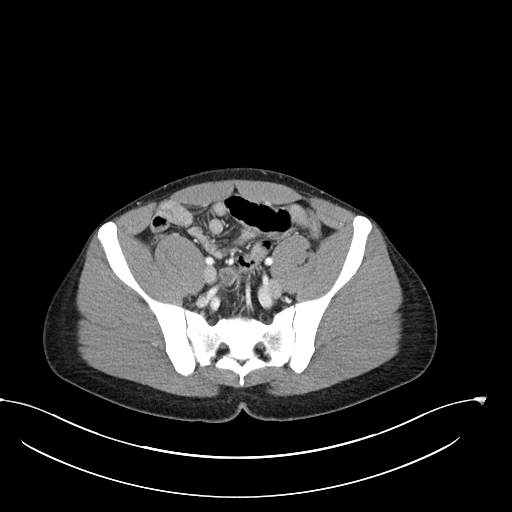
[im 35/84  soft-tissue]
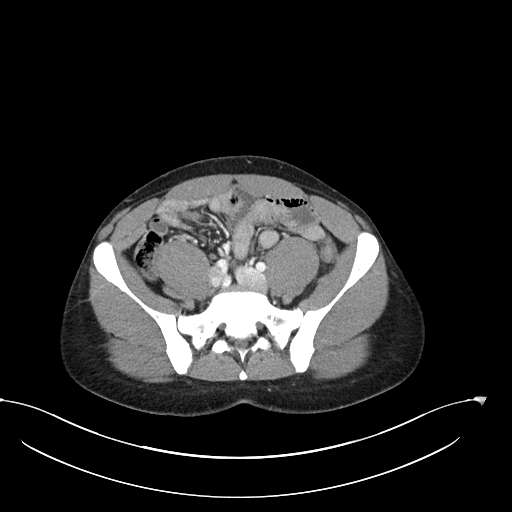
[im 44/84  soft-tissue]
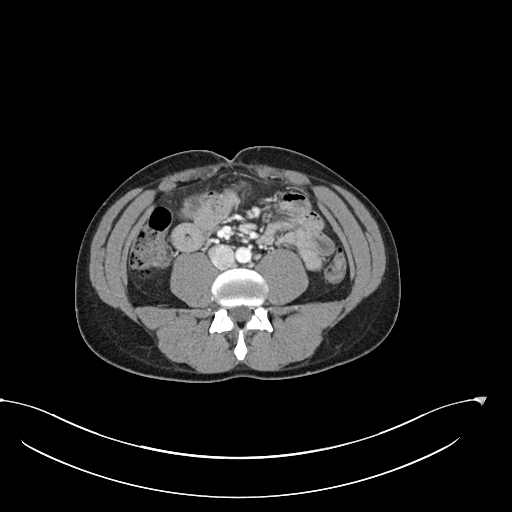
[im 49/84  soft-tissue]
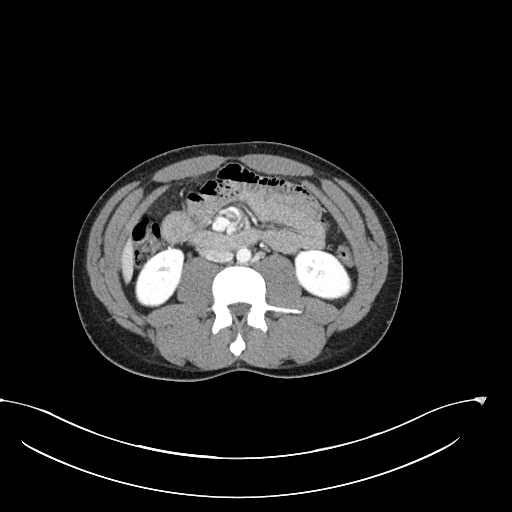
[im 53/84  soft-tissue]
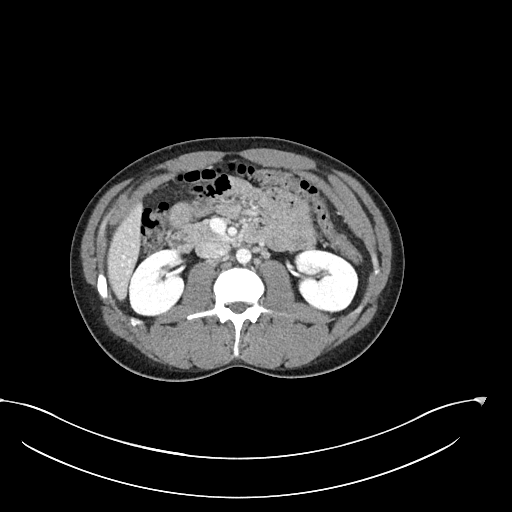
[im 53/84  bone]
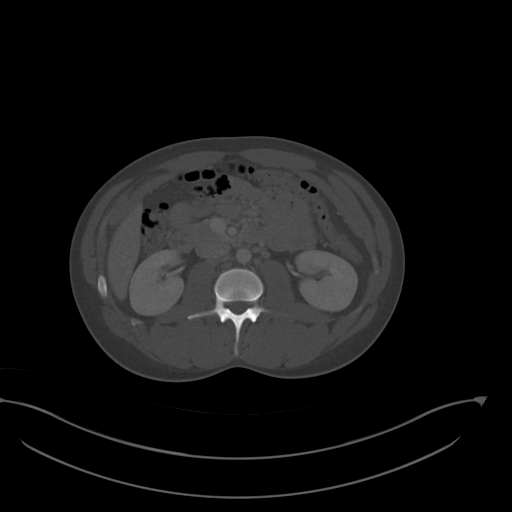
[im 62/84  soft-tissue]
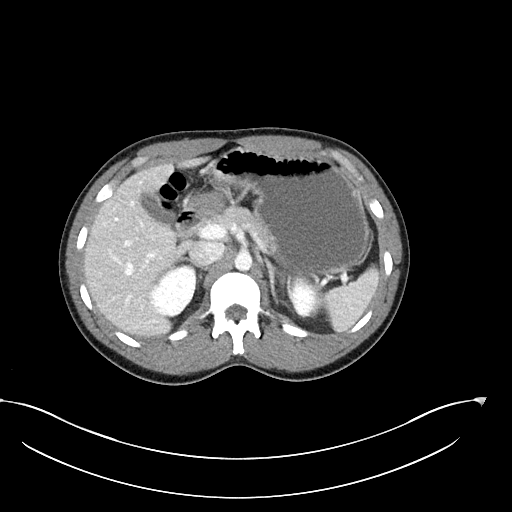
[im 66/84  soft-tissue]
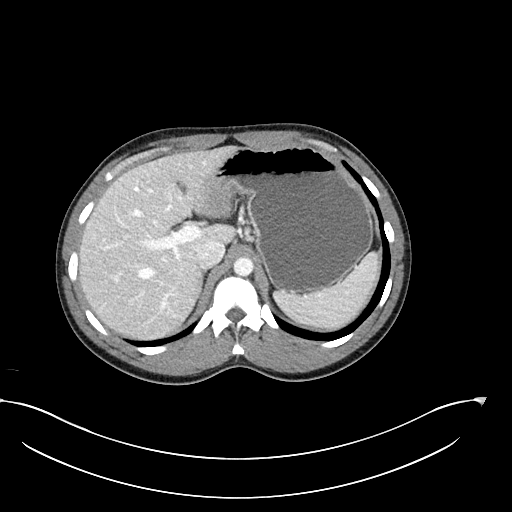
[im 70/84  soft-tissue]
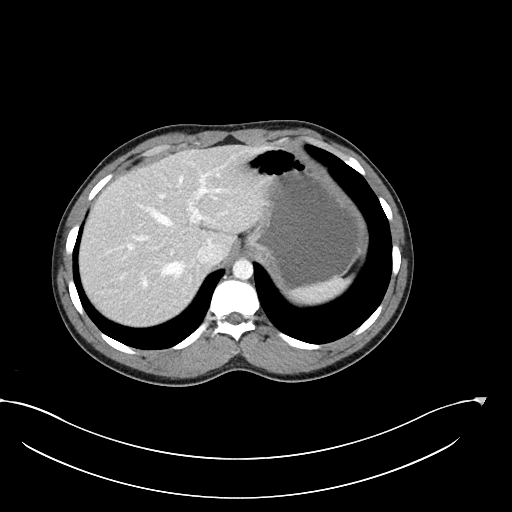
[im 79/84  soft-tissue]
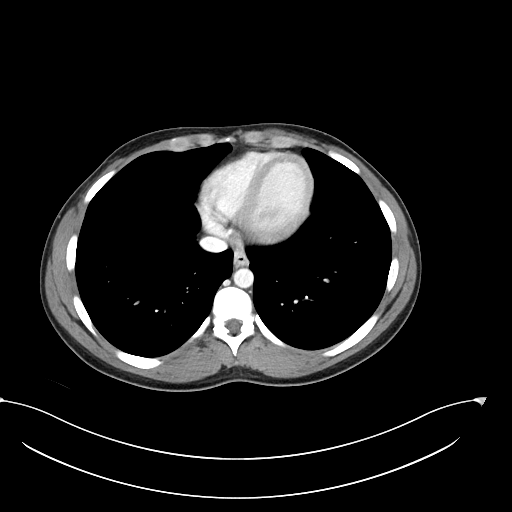

[Series 6: abdomen 3.0 mpr cor · coronal · 0.72mm/px · 3 of 90 slices shown]
[im 30/90  soft-tissue]
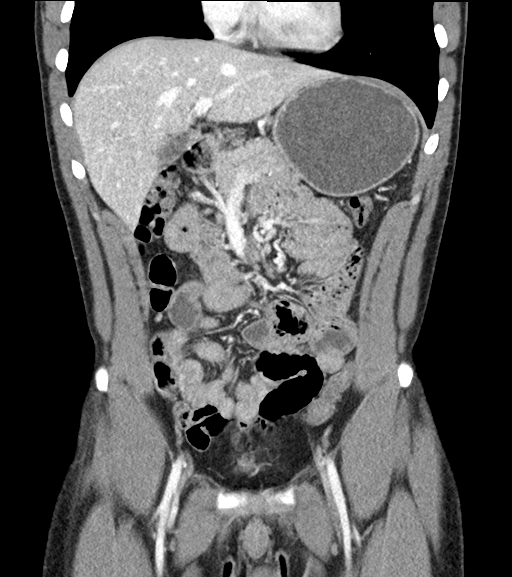
[im 40/90  soft-tissue]
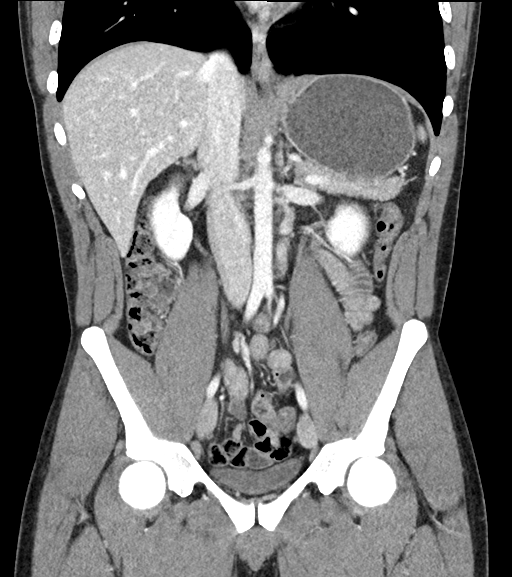
[im 50/90  soft-tissue]
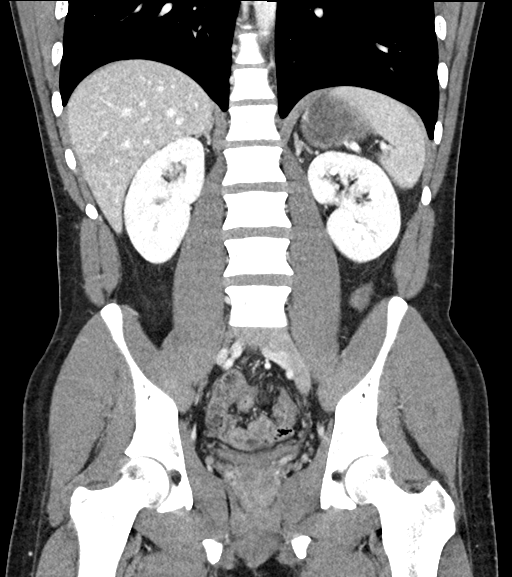

[16 of 46 positions shown; findings below may reference images not displayed]

RADIATION DOSE REDUCTION: This exam was performed according to the
departmental dose-optimization program which includes automated
exposure control, adjustment of the mA and/or kV according to
patient size and/or use of iterative reconstruction technique.

CONTRAST:  100mL OMNIPAQUE IOHEXOL 300 MG/ML  SOLN
FINDINGS: Lower chest: No acute abnormality.

Hepatobiliary: No focal liver abnormality is seen. No gallstones,
gallbladder wall thickening, or biliary dilatation.

Pancreas: Unremarkable. No pancreatic ductal dilatation or
surrounding inflammatory changes.

Spleen: Normal in size without focal abnormality.

Adrenals/Urinary Tract: Adrenal glands are unremarkable. Kidneys are
normal, without renal calculi, focal lesion, or hydronephrosis.
Bladder is unremarkable.

Stomach/Bowel: Moderate fluid distension of the stomach. No dilated
small bowel. Negative appendix.

Vascular/Lymphatic: No significant vascular findings are present. No
enlarged abdominal or pelvic lymph nodes.

Reproductive: Prostate is unremarkable.

Other: No abdominal wall hernia or abnormality. No abdominopelvic
ascites.

Musculoskeletal: No acute or significant osseous findings.
IMPRESSION: Fluid distension of the stomach. Otherwise no CT evidence for acute
intra-abdominal or pelvic abnormality.

## 2023-06-10 ENCOUNTER — Telehealth: Payer: Self-pay

## 2023-06-10 NOTE — Telephone Encounter (Signed)
Transition Care Management Unsuccessful Follow-up Telephone Call  Date of discharge and from where:  05/11/2023 [facility]  Attempts:  1st Attempt  Reason for unsuccessful TCM follow-up call:  No answer/busy  Kandis Henry [facility]  [facility], [facility] Resource Care Guide Direct Dial: [phone]  Website: [email or web address]

## 2023-06-14 ENCOUNTER — Telehealth: Payer: Self-pay

## 2023-06-14 NOTE — Telephone Encounter (Signed)
Transition Care Management Unsuccessful Follow-up Telephone Call  Date of discharge and from where:  05/11/2023 [facility]  Attempts:  2nd Attempt  Reason for unsuccessful TCM follow-up call:  No answer/busy  Arla Boutwell [facility]  [facility], [facility] Resource Care Guide Direct Dial: [phone]  Website: [email or web address]

## 2023-06-24 ENCOUNTER — Ambulatory Visit (HOSPITAL_COMMUNITY)
Admission: EM | Admit: 2023-06-24 | Discharge: 2023-06-24 | Disposition: A | Discharge status: Home / Self Care | Payer: MEDICAID | Attending: Physician Assistant | Admitting: Physician Assistant

## 2023-06-24 ENCOUNTER — Encounter (HOSPITAL_COMMUNITY): Payer: Self-pay

## 2023-06-24 DIAGNOSIS — R519 Headache, unspecified: Secondary | ICD-10-CM

## 2023-06-24 MED ORDER — KETOROLAC TROMETHAMINE 15 MG/ML IJ SOLN: 30.0000 mg | Freq: Once | INTRAMUSCULAR | Status: DC

## 2023-06-24 NOTE — ED Triage Notes (Signed)
Pt c/o headache, pressure to eyes, slight runny nose, and abdominal pain since yesterday. States hasn't ate since yesterday d/t sleeping al day.

## 2023-06-24 NOTE — Discharge Instructions (Signed)
Return if any problems.

## 2023-06-24 NOTE — ED Provider Notes (Signed)
[facility]    CSN: [id number] Arrival date & time: 06/24/23  1736      History   Chief Complaint Chief Complaint  Patient presents with   Headache    HPI Michael [name]. is a 27 y.o. male.   Patient complains of a headache.  Patient reports he has had a headache for the past 2 days.  Patient states that he missed work yesterday and today because of the headache.  Patient reports he is not taking any medications at home.  Patient reports he has been drinking fluids he has not been eating today.  Patient reports that he has not had a fever or chills.  He has not had any cough or congestion.  Patient reports he has had headaches in the past.   Headache   History reviewed. No pertinent past medical history.  There are no problems to display for this patient.   History reviewed. No pertinent surgical history.     Home Medications    Prior to Admission medications   Medication Sig Start Date End Date Taking? Authorizing Provider  naproxen (NAPROSYN) 500 MG tablet Take 1 tablet (500 mg total) by mouth 2 (two) times daily. Patient not taking: Reported on 05/11/2023 08/30/22   Debby Freiberg, NP  ondansetron (ZOFRAN-ODT) 4 MG disintegrating tablet Take 1 tablet (4 mg total) by mouth every 8 (eight) hours as needed for nausea or vomiting. Patient not taking: Reported on 05/11/2023 10/14/21   Haskel Schroeder, PA-C    Family History History reviewed. No pertinent family history.  Social History Social History   Tobacco Use   Smoking status: Every Day   Smokeless tobacco: Never  Vaping Use   Vaping status: Every Day  Substance Use Topics   Alcohol use: Yes   Drug use: Not Currently     Allergies   Food and Shellfish-derived products   Review of Systems Review of Systems  Neurological:  Positive for headaches.  All other systems reviewed and are negative.    Physical Exam Triage Vital Signs ED Triage Vitals [06/24/23 1821]  Encounter  Vitals Group     BP 139/82     Systolic BP Percentile      Diastolic BP Percentile      Pulse Rate 97     Resp 18     Temp 98.3 F (36.8 C)     Temp Source Oral     SpO2 97 %     Weight      Height      Head Circumference      Peak Flow      Pain Score 7     Pain Loc      Pain Education      Exclude from Growth Chart    No data found.  Updated Vital Signs BP 139/82 (BP Location: Right Arm)   Pulse 97   Temp 98.3 F (36.8 C) (Oral)   Resp 18   SpO2 97%   Visual Acuity Right Eye Distance:   Left Eye Distance:   Bilateral Distance:    Right Eye Near:   Left Eye Near:    Bilateral Near:     Physical Exam Vitals and nursing note reviewed.  Constitutional:      Appearance: He is well-developed.  HENT:     Head: Normocephalic.  Eyes:     Extraocular Movements: Extraocular movements intact.     Pupils: Pupils are equal, round, and reactive to  light.  Cardiovascular:     Rate and Rhythm: Normal rate.  Pulmonary:     Effort: Pulmonary effort is normal.  Abdominal:     General: There is no distension.  Musculoskeletal:        General: Normal range of motion.     Cervical back: Normal range of motion.  Skin:    General: Skin is warm.  Neurological:     Mental Status: He is alert and oriented to person, place, and time.  Psychiatric:        Mood and Affect: Mood normal.      UC Treatments / Results  Labs (all labs ordered are listed, but only abnormal results are displayed) Labs Reviewed - No data to display  EKG   Radiology No results found.  Procedures Procedures (including critical care time)  Medications Ordered in UC Medications  ketorolac (TORADOL) 15 MG/ML injection 30 mg (has no administration in time range)    Initial Impression / Assessment and Plan / UC Course  I have reviewed the triage vital signs and the nursing notes.  Pertinent labs & imaging results that were available during my care of the patient were reviewed by me and  considered in my medical decision making (see chart for details).     Patient given injection of Toradol.  He is advised to drink plenty of fluids.  I think patient's headache is most likely secondary to sleep deprivation he is advised to sleep.  He is given a note for missing work today he is advised to follow-up with his physician for recheck. Final Clinical Impressions(s) / UC Diagnoses   Final diagnoses:  Acute nonintractable headache, unspecified headache type     Discharge Instructions      Return if any problems.    ED Prescriptions   None    PDMP not reviewed this encounter. An After Visit Summary was printed and given to the patient.       Elson Areas, [place] 06/24/23 1914

## 2023-07-08 ENCOUNTER — Encounter (HOSPITAL_COMMUNITY): Payer: Self-pay | Admitting: Emergency Medicine

## 2023-07-08 ENCOUNTER — Emergency Department (HOSPITAL_COMMUNITY): Payer: MEDICAID

## 2023-07-08 ENCOUNTER — Emergency Department (HOSPITAL_COMMUNITY)
Admission: EM | Admit: 2023-07-08 | Discharge: 2023-07-08 | Disposition: A | Discharge status: Home / Self Care | Payer: MEDICAID | Source: Home / Self Care | Attending: Emergency Medicine | Admitting: Emergency Medicine

## 2023-07-08 ENCOUNTER — Other Ambulatory Visit: Payer: Self-pay

## 2023-07-08 ENCOUNTER — Emergency Department (HOSPITAL_COMMUNITY)
Admission: EM | Admit: 2023-07-08 | Discharge: 2023-07-08 | Discharge status: Against Medical Advice | Payer: MEDICAID | Attending: Emergency Medicine | Admitting: Emergency Medicine

## 2023-07-08 DIAGNOSIS — T40721A Poisoning by synthetic cannabinoids, accidental (unintentional), initial encounter: Secondary | ICD-10-CM | POA: Insufficient documentation

## 2023-07-08 DIAGNOSIS — T41295A Adverse effect of other general anesthetics, initial encounter: Secondary | ICD-10-CM | POA: Insufficient documentation

## 2023-07-08 DIAGNOSIS — R2681 Unsteadiness on feet: Secondary | ICD-10-CM | POA: Insufficient documentation

## 2023-07-08 DIAGNOSIS — T50901A Poisoning by unspecified drugs, medicaments and biological substances, accidental (unintentional), initial encounter: Secondary | ICD-10-CM

## 2023-07-08 HISTORY — DX: Other psychoactive substance abuse, uncomplicated: F19.10

## 2023-07-08 LAB — CBC WITH DIFFERENTIAL/PLATELET
Abs Immature Granulocytes: 0.05 10*3/uL (ref 0.00–0.07)
Basophils Absolute: 0 10*3/uL (ref 0.0–0.1)
Basophils Relative: 0 %
Eosinophils Absolute: 0.2 10*3/uL (ref 0.0–0.5)
Eosinophils Relative: 2 %
HCT: 39 % (ref 39.0–52.0)
Hemoglobin: 12.7 g/dL — ABNORMAL LOW (ref 13.0–17.0)
Immature Granulocytes: 0 %
Lymphocytes Relative: 22 %
Lymphs Abs: 2.5 10*3/uL (ref 0.7–4.0)
MCH: 27.8 pg (ref 26.0–34.0)
MCHC: 32.6 g/dL (ref 30.0–36.0)
MCV: 85.3 fL (ref 80.0–100.0)
Monocytes Absolute: 0.6 10*3/uL (ref 0.1–1.0)
Monocytes Relative: 5 %
Neutro Abs: 7.9 10*3/uL — ABNORMAL HIGH (ref 1.7–7.7)
Neutrophils Relative %: 71 %
Platelets: 373 10*3/uL (ref 150–400)
RBC: 4.57 MIL/uL (ref 4.22–5.81)
RDW: 14.6 % (ref 11.5–15.5)
WBC: 11.3 10*3/uL — ABNORMAL HIGH (ref 4.0–10.5)
nRBC: 0 % (ref 0.0–0.2)

## 2023-07-08 LAB — COMPREHENSIVE METABOLIC PANEL
ALT: 37 U/L (ref 0–44)
AST: 24 U/L (ref 15–41)
Albumin: 4 g/dL (ref 3.5–5.0)
Alkaline Phosphatase: 50 U/L (ref 38–126)
Anion gap: 9 (ref 5–15)
BUN: 11 mg/dL (ref 6–20)
CO2: 24 mmol/L (ref 22–32)
Calcium: 9.2 mg/dL (ref 8.9–10.3)
Chloride: 104 mmol/L (ref 98–111)
Creatinine, Ser: 0.89 mg/dL (ref 0.61–1.24)
GFR, Estimated: >60 mL/min (ref >60)
Glucose, Bld: 119 mg/dL — ABNORMAL HIGH (ref 70–99)
Potassium: 3.4 mmol/L — ABNORMAL LOW (ref 3.5–5.1)
Sodium: 137 mmol/L (ref 135–145)
Total Bilirubin: 0.5 mg/dL (ref <1.2)
Total Protein: 7 g/dL (ref 6.5–8.1)

## 2023-07-08 LAB — RAPID URINE DRUG SCREEN, HOSP PERFORMED
Amphetamines: NOT DETECTED
Barbiturates: NOT DETECTED
Benzodiazepines: NOT DETECTED
Cocaine: POSITIVE — AB
Opiates: NOT DETECTED
Tetrahydrocannabinol: NOT DETECTED

## 2023-07-08 LAB — ETHANOL: Alcohol, Ethyl (B): <10 mg/dL (ref <10)

## 2023-07-08 MED ORDER — SODIUM CHLORIDE 0.9 % IV BOLUS: 1000.0000 mL | Freq: Once | INTRAVENOUS | Status: DC

## 2023-07-08 NOTE — Discharge Instructions (Signed)
Follow up with your family doc in the office.  

## 2023-07-08 NOTE — ED Provider Notes (Signed)
[place] EMERGENCY DEPARTMENT AT [facility] Provider Note   CSN: [id number] Arrival date & time: 07/08/23  1607     History  Chief Complaint  Patient presents with   Ingestion    Michael [name]. is a 27 y.o. male.  27 yo M with a chief complaints of not feeling right.  Reportedly the patient had ingested synthetic marijuana earlier today.  He tried to go to work but was too unstable on his feet and so was sent here for evaluation.  Patient is not willing to give me the information level 5 caveat.   Ingestion       Home Medications Prior to Admission medications   Medication Sig Start Date End Date Taking? Authorizing Provider  naproxen (NAPROSYN) 500 MG tablet Take 1 tablet (500 mg total) by mouth 2 (two) times daily. Patient not taking: Reported on 05/11/2023 08/30/22   Debby Freiberg, NP  ondansetron (ZOFRAN-ODT) 4 MG disintegrating tablet Take 1 tablet (4 mg total) by mouth every 8 (eight) hours as needed for nausea or vomiting. Patient not taking: Reported on 05/11/2023 10/14/21   Haskel Schroeder, PA-C      Allergies    Food and Shellfish-derived products    Review of Systems   Review of Systems  Physical Exam Updated Vital Signs BP (!) 105/53 (BP Location: Left Arm)   Pulse 69   Temp 97.8 F (36.6 C) (Oral)   Resp 16   SpO2 99%  Physical Exam Vitals and nursing note reviewed.  Constitutional:      Appearance: He is well-developed.  HENT:     Head: Normocephalic and atraumatic.  Eyes:     Pupils: Pupils are equal, round, and reactive to light.  Neck:     Vascular: No JVD.  Cardiovascular:     Rate and Rhythm: Normal rate and regular rhythm.     Heart sounds: No murmur heard.    No friction rub. No gallop.  Pulmonary:     Effort: No respiratory distress.     Breath sounds: No wheezing.  Abdominal:     General: There is no distension.     Tenderness: There is no abdominal tenderness. There is no guarding or rebound.   Musculoskeletal:        General: Normal range of motion.     Cervical back: Normal range of motion and neck supple.  Skin:    Coloration: Skin is not pale.     Findings: No rash.  Neurological:     Mental Status: He is alert.     Comments: Patient awakens to light touch.  Quickly falls back asleep.  Tracks me with his eyes.  Psychiatric:        Behavior: Behavior normal.     ED Results / Procedures / Treatments   Labs (all labs ordered are listed, but only abnormal results are displayed) Labs Reviewed  COMPREHENSIVE METABOLIC PANEL - Abnormal; Notable for the following components:      Result Value   Potassium 3.4 (*)    Glucose, Bld 119 (*)    All other components within normal limits  CBC WITH DIFFERENTIAL/PLATELET - Abnormal; Notable for the following components:   WBC 11.3 (*)    Hemoglobin 12.7 (*)    Neutro Abs 7.9 (*)    All other components within normal limits  ETHANOL    EKG None  Radiology CT Head Wo Contrast  Result Date: 07/08/2023 CLINICAL DATA:  Drug overdose, lethargy  EXAM: CT HEAD WITHOUT CONTRAST TECHNIQUE: Contiguous axial images were obtained from the base of the skull through the vertex without intravenous contrast. RADIATION DOSE REDUCTION: This exam was performed according to the departmental dose-optimization program which includes automated exposure control, adjustment of the mA and/or kV according to patient size and/or use of iterative reconstruction technique. COMPARISON:  No prior CT head available, correlation is made with CT maxillofacial. FINDINGS: Brain: No evidence of acute infarction, hemorrhage, mass, mass effect, or midline shift. No hydrocephalus or extra-axial fluid collection. Gray-white differentiation is preserved. The basilar cisterns are patent. Vascular: No hyperdense vessel. Skull: Negative for fracture or focal lesion. Sinuses/Orbits: No acute finding. Other: The mastoid air cells are well aerated. IMPRESSION: No acute  intracranial process. Electronically Signed   By: Wiliam Ke M.D.   On: 07/08/2023 15:09    Procedures Procedures    Medications Ordered in ED Medications - No data to display  ED Course/ Medical Decision Making/ A&P                                 Medical Decision Making Amount and/or Complexity of Data Reviewed Labs: ordered.   27 yo M with a cc of ingestion of synthetic marijuana.  He was seen here earlier today and then left prior to results.  He is back and is still a bit sleepy.  Not willing to comply much with the exam but is easily arousable.  Will observe in the ED.  Patient has improved while in the emergency department.  Is able to ambulate to the bathroom independently.  He is able to make his own decisions.  Will discharge home.  PCP follow-up.  10:14 PM:  I have discussed the diagnosis/risks/treatment options with the patient.  Evaluation and diagnostic testing in the emergency department does not suggest an emergent condition requiring admission or immediate intervention beyond what has been performed at this time.  They will follow up with PCP. We also discussed returning to the ED immediately if new or worsening sx occur. We discussed the sx which are most concerning (e.g., sudden worsening pain, fever, inability to tolerate by mouth) that necessitate immediate return. Medications administered to the patient during their visit and any new prescriptions provided to the patient are listed below.  Medications given during this visit Medications - No data to display   The patient appears reasonably screen and/or stabilized for discharge and I doubt any other medical condition or other [facility] requiring further screening, evaluation, or treatment in the ED at this time prior to discharge.          Final Clinical Impression(s) / ED Diagnoses Final diagnoses:  Ketamine adverse reaction, initial encounter    Rx / DC Orders ED Discharge Orders     None          Melene Plan, DO 07/08/23 2214

## 2023-07-08 NOTE — ED Triage Notes (Addendum)
Patient ingested K 2 about an hour ago. He has been lethargic since. Pupils 4 and reactive. He sat on the porch and could not get up. His girlfriend called EMS.  Patient is currently very lethargic. He will respond to stimulation, but does not vocalize answers.   HX Drug use    EMS vitals: 118/64 BP 70 HR 92 % SPO2 on room air (initial), 97% SPO2 on room air (final) 91 CBG

## 2023-07-08 NOTE — ED Provider Notes (Signed)
[place] EMERGENCY DEPARTMENT AT [facility] Provider Note   CSN: [id number] Arrival date & time: 07/08/23  1328     History  Chief Complaint  Patient presents with   Fatigue   Drug Overdose    Michael [name]. is a 27 y.o. male.  Patient has a history of substance abuse.  According to his girlfriend she thinks he was smoking some special K and became lethargic.  The history is provided by a friend. No language interpreter was used.  Drug Overdose This is a recurrent problem. The current episode started 3 to 5 hours ago. The problem occurs constantly. The problem has not changed since onset.Pertinent negatives include no chest pain. Nothing aggravates the symptoms. Nothing relieves the symptoms. He has tried nothing for the symptoms.       Home Medications Prior to Admission medications   Medication Sig Start Date End Date Taking? Authorizing Provider  naproxen (NAPROSYN) 500 MG tablet Take 1 tablet (500 mg total) by mouth 2 (two) times daily. Patient not taking: Reported on 05/11/2023 08/30/22   Debby Freiberg, NP  ondansetron (ZOFRAN-ODT) 4 MG disintegrating tablet Take 1 tablet (4 mg total) by mouth every 8 (eight) hours as needed for nausea or vomiting. Patient not taking: Reported on 05/11/2023 10/14/21   Haskel Schroeder, PA-C      Allergies    Food and Shellfish-derived products    Review of Systems   Review of Systems  Unable to perform ROS: Mental status change  Cardiovascular:  Negative for chest pain.    Physical Exam Updated Vital Signs BP (!) 104/55   Pulse 60   Temp 97.9 F (36.6 C) (Oral)   Resp (!) 8   SpO2 98%  Physical Exam Vitals and nursing note reviewed.  Constitutional:      Appearance: He is well-developed.     Comments: Lethargic  HENT:     Head: Normocephalic.     Nose: Nose normal.  Eyes:     General: No scleral icterus.    Conjunctiva/sclera: Conjunctivae normal.  Neck:     Thyroid: No thyromegaly.   Cardiovascular:     Rate and Rhythm: Normal rate and regular rhythm.     Heart sounds: No murmur heard.    No friction rub. No gallop.  Pulmonary:     Breath sounds: No stridor. No wheezing or rales.  Chest:     Chest wall: No tenderness.  Abdominal:     General: There is no distension.     Tenderness: There is no abdominal tenderness. There is no rebound.  Musculoskeletal:        General: Normal range of motion.     Cervical back: Neck supple.  Lymphadenopathy:     Cervical: No cervical adenopathy.  Skin:    Findings: No erythema or rash.  Neurological:     Mental Status: He is oriented to person, place, and time.     Motor: No abnormal muscle tone.     Coordination: Coordination normal.  Psychiatric:        Behavior: Behavior normal.     ED Results / Procedures / Treatments   Labs (all labs ordered are listed, but only abnormal results are displayed) Labs Reviewed  COMPREHENSIVE METABOLIC PANEL  ETHANOL  CBC  RAPID URINE DRUG SCREEN, HOSP PERFORMED    EKG None  Radiology CT Head Wo Contrast  Result Date: 07/08/2023 CLINICAL DATA:  Drug overdose, lethargy EXAM: CT HEAD WITHOUT CONTRAST TECHNIQUE:  Contiguous axial images were obtained from the base of the skull through the vertex without intravenous contrast. RADIATION DOSE REDUCTION: This exam was performed according to the departmental dose-optimization program which includes automated exposure control, adjustment of the mA and/or kV according to patient size and/or use of iterative reconstruction technique. COMPARISON:  No prior CT head available, correlation is made with CT maxillofacial. FINDINGS: Brain: No evidence of acute infarction, hemorrhage, mass, mass effect, or midline shift. No hydrocephalus or extra-axial fluid collection. Gray-white differentiation is preserved. The basilar cisterns are patent. Vascular: No hyperdense vessel. Skull: Negative for fracture or focal lesion. Sinuses/Orbits: No acute finding.  Other: The mastoid air cells are well aerated. IMPRESSION: No acute intracranial process. Electronically Signed   By: Wiliam Ke M.D.   On: 07/08/2023 15:09    Procedures Procedures    Medications Ordered in ED Medications  sodium chloride 0.9 % bolus 1,000 mL (has no administration in time range)    ED Course/ Medical Decision Making/ A&P                                 Medical Decision Making Amount and/or Complexity of Data Reviewed Labs: ordered. Radiology: ordered. ECG/medicine tests: ordered.   Substance abuse.  Patient left AMA        Final Clinical Impression(s) / ED Diagnoses Final diagnoses:  Accidental overdose, initial encounter    Rx / DC Orders ED Discharge Orders     None         Bethann Berkshire, MD 07/08/23 [phone]

## 2023-07-08 NOTE — ED Notes (Signed)
Went to get labs and pt and belongings and girlfriend are gone from room.  Pt eloped.  Pt had come back from CT aprox 20 min ago. Last time seen.

## 2023-07-08 NOTE — ED Triage Notes (Signed)
Patient arrives by [facility] coming from work states he smoked some synthetic marijuana. Patient was here already and left then went to work. Work called EMS and said pt was not steady on his feet and sent him back. Ems reports patient eating container cake icing PTA.

## 2023-10-22 ENCOUNTER — Ambulatory Visit (HOSPITAL_COMMUNITY)
Admission: EM | Admit: 2023-10-22 | Discharge: 2023-10-22 | Disposition: A | Discharge status: Home / Self Care | Payer: MEDICAID | Attending: Emergency Medicine | Admitting: Emergency Medicine

## 2023-10-22 ENCOUNTER — Encounter (HOSPITAL_COMMUNITY): Payer: Self-pay

## 2023-10-22 ENCOUNTER — Ambulatory Visit (INDEPENDENT_AMBULATORY_CARE_PROVIDER_SITE_OTHER): Payer: MEDICAID

## 2023-10-22 DIAGNOSIS — S62322A Displaced fracture of shaft of third metacarpal bone, right hand, initial encounter for closed fracture: Secondary | ICD-10-CM

## 2023-10-22 DIAGNOSIS — M79671 Pain in right foot: Secondary | ICD-10-CM | POA: Diagnosis not present

## 2023-10-22 DIAGNOSIS — S62352A Nondisplaced fracture of shaft of third metacarpal bone, right hand, initial encounter for closed fracture: Secondary | ICD-10-CM | POA: Diagnosis not present

## 2023-10-22 MED ORDER — IBUPROFEN 800 MG PO TABS: 800.0000 mg | ORAL_TABLET | Freq: Three times a day (TID) | ORAL | 0 refills | Status: AC

## 2023-10-22 MED ORDER — IBUPROFEN 800 MG PO TABS
800.0000 mg | ORAL_TABLET | Freq: Once | ORAL | Status: AC
  Administered 2023-10-22: 800 mg via ORAL

## 2023-10-22 MED ORDER — IBUPROFEN 800 MG PO TABS
ORAL_TABLET | ORAL | Status: AC
  Filled 2023-10-22: qty 1

## 2023-10-22 NOTE — Progress Notes (Signed)
 Orthopedic Tech Progress Note Patient Details:  Michael [name]. 05/22/1996 [id number]  Ortho Devices Type of Ortho Device: Rad Gutter splint Ortho Device/Splint Location: RUE Ortho Device/Splint Interventions: Ordered, Application, Adjustment   Post Interventions Patient Tolerated: Well Instructions Provided: Care of device  [name] Carmine Savoy 10/22/2023, 4:25 PM
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
## 2023-10-22 NOTE — Discharge Instructions (Addendum)
 Keep the splint on 24/7. Do not take it off. Cover splint with plastic when bathing.  Go to [facility] on Tuesday March 4th to see Dr. Frazier Butt. He is the hand specialist who I spoke to about you.  It is very important to follow up with the specialist.   Ibuprofen for pain -- you can take 1 tablet every 6 hours  Apply ice to the foot and elevate to reduce pain and swelling

## 2023-10-22 NOTE — ED Notes (Signed)
 Ortho tech called and in route.
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
## 2023-11-01 ENCOUNTER — Encounter (HOSPITAL_COMMUNITY): Payer: Self-pay | Admitting: Orthopedic Surgery

## 2023-11-01 ENCOUNTER — Other Ambulatory Visit: Payer: Self-pay

## 2023-11-01 NOTE — Progress Notes (Signed)
 SDW CALL  Patient was given pre-op instructions over the phone. The opportunity was given for the patient to ask questions. No further questions asked. Patient verbalized understanding of instructions given.   PCP - denies Cardiologist denies  PPM/ICD - denies Device Orders -  Rep Notified -   Chest x-ray - 10/14/21 EKG - 05/11/23 Stress Test - none ECHO - none Cardiac Cath - none  Sleep Study - none CPAP -     ERAS Protcol - clears until 1400. Pt stated he would only drink water.  PRE-SURGERY Ensure or G2- no  COVID TEST-    Anesthesia review:   Patient denies shortness of breath, fever, cough and chest pain over the phone call    Surgical Instructions    Your procedure is scheduled on March 12  Report to [facility] Main Entrance "A" at 1445 P.M., then check in with the Admitting office.  Call this number if you have problems the morning of surgery:  [phone]    Remember:  Do not eat after midnight the night before your surgery  You may drink clear liquids until 1415 the day of your surgery.   Clear liquids allowed are: Water, Non-Citrus Juices (without pulp), Carbonated Beverages, Clear Tea, Black Coffee ONLY (NO MILK, CREAM OR POWDERED CREAMER of any kind), and Gatorade   Take these medicines the morning of surgery with A SIP OF WATER: none    As of today, STOP taking any Aspirin (unless otherwise instructed by your surgeon) Aleve, Naproxen, Ibuprofen, Motrin, Advil, Goody's, BC's, all herbal medications, fish oil, and all vitamins.   is not responsible for any belongings or valuables. .   Do NOT Smoke (Tobacco/Vaping)  24 hours prior to your procedure  If you use a CPAP at night, you may bring your mask for your overnight stay.   Contacts, glasses, hearing aids, dentures or partials may not be worn into surgery, please bring cases for these belongings   Patients discharged the day of surgery will not be allowed to drive home, and  someone needs to stay with them for 24 hours.   Special instructions:    Oral Hygiene is also important to reduce your risk of infection.  Remember - BRUSH YOUR TEETH THE MORNING OF SURGERY WITH YOUR REGULAR TOOTHPASTE   Day of Surgery:  Take a shower the day of or night before with antibacterial soap. Wear Clean/Comfortable clothing the morning of surgery Do not apply any deodorants/lotions.   Do not wear jewelry or makeup Do not wear lotions, powders, perfumes/colognes, or deodorant. Do not shave 48 hours prior to surgery.  Men may shave face and neck. Do not bring valuables to the hospital. Do not wear nail polish, gel polish, artificial nails, or any other type of covering on natural nails (fingers and toes) If you have artificial nails or gel coating that need to be removed by a nail salon, please have this removed prior to surgery. Artificial nails or gel coating may interfere with anesthesia's ability to adequately monitor your vital signs. Remember to brush your teeth WITH YOUR REGULAR TOOTHPASTE.

## 2023-11-02 ENCOUNTER — Ambulatory Visit (HOSPITAL_COMMUNITY): Payer: MEDICAID

## 2023-11-02 ENCOUNTER — Ambulatory Visit (HOSPITAL_COMMUNITY)
Admission: RE | Admit: 2023-11-02 | Discharge: 2023-11-02 | Disposition: A | Discharge status: Home / Self Care | Payer: MEDICAID | Attending: Orthopedic Surgery | Admitting: Orthopedic Surgery

## 2023-11-02 ENCOUNTER — Other Ambulatory Visit: Payer: Self-pay

## 2023-11-02 ENCOUNTER — Encounter (HOSPITAL_COMMUNITY): Payer: Self-pay | Admitting: Anesthesiology

## 2023-11-02 ENCOUNTER — Encounter (HOSPITAL_COMMUNITY): Payer: Self-pay | Admitting: Orthopedic Surgery

## 2023-11-02 ENCOUNTER — Encounter (HOSPITAL_COMMUNITY)
Admission: RE | Disposition: A | Discharge status: Home / Self Care | Payer: Self-pay | Source: Home / Self Care | Attending: Orthopedic Surgery

## 2023-11-02 DIAGNOSIS — Z539 Procedure and treatment not carried out, unspecified reason: Secondary | ICD-10-CM | POA: Insufficient documentation

## 2023-11-02 DIAGNOSIS — S62322A Displaced fracture of shaft of third metacarpal bone, right hand, initial encounter for closed fracture: Secondary | ICD-10-CM | POA: Insufficient documentation

## 2023-11-02 LAB — COMPREHENSIVE METABOLIC PANEL
ALT: 32 U/L (ref 0–44)
AST: 39 U/L (ref 15–41)
Albumin: 4.1 g/dL (ref 3.5–5.0)
Alkaline Phosphatase: 46 U/L (ref 38–126)
Anion gap: 10 (ref 5–15)
BUN: 7 mg/dL (ref 6–20)
CO2: 29 mmol/L (ref 22–32)
Calcium: 9.5 mg/dL (ref 8.9–10.3)
Chloride: 98 mmol/L (ref 98–111)
Creatinine, Ser: 1.04 mg/dL (ref 0.61–1.24)
GFR, Estimated: >60 mL/min (ref >60)
Glucose, Bld: 93 mg/dL (ref 70–99)
Potassium: 3.3 mmol/L — ABNORMAL LOW (ref 3.5–5.1)
Sodium: 137 mmol/L (ref 135–145)
Total Bilirubin: 0.4 mg/dL (ref 0.0–1.2)
Total Protein: 6.7 g/dL (ref 6.5–8.1)

## 2023-11-02 LAB — CBC
HCT: 39.6 % (ref 39.0–52.0)
Hemoglobin: 12.8 g/dL — ABNORMAL LOW (ref 13.0–17.0)
MCH: 27.4 pg (ref 26.0–34.0)
MCHC: 32.3 g/dL (ref 30.0–36.0)
MCV: 84.8 fL (ref 80.0–100.0)
Platelets: 363 10*3/uL (ref 150–400)
RBC: 4.67 MIL/uL (ref 4.22–5.81)
RDW: 14.2 % (ref 11.5–15.5)
WBC: 10 10*3/uL (ref 4.0–10.5)
nRBC: 0 % (ref 0.0–0.2)

## 2023-11-02 SURGERY — CANCELLED PROCEDURE
Site: Finger | Laterality: Right

## 2023-11-02 MED ORDER — CEFAZOLIN SODIUM-DEXTROSE 2-4 GM/100ML-% IV SOLN
2.0000 g | INTRAVENOUS | Status: DC
  Filled 2023-11-02: qty 100

## 2023-11-02 MED ORDER — ORAL CARE MOUTH RINSE: 15.0000 mL | Freq: Once | OROMUCOSAL | Status: AC

## 2023-11-02 MED ORDER — CHLORHEXIDINE GLUCONATE 0.12 % MT SOLN
15.0000 mL | Freq: Once | OROMUCOSAL | Status: AC
  Filled 2023-11-02: qty 15

## 2023-11-02 MED ORDER — 0.9 % SODIUM CHLORIDE (POUR BTL) OPTIME: TOPICAL | Status: DC | PRN

## 2023-11-02 MED ORDER — LACTATED RINGERS IV SOLN: INTRAVENOUS | Status: DC

## 2023-11-02 MED ORDER — CHLORHEXIDINE GLUCONATE 0.12 % MT SOLN
OROMUCOSAL | Status: AC
  Administered 2023-11-02: 15 mL via OROMUCOSAL
  Filled 2023-11-02: qty 15

## 2023-11-02 NOTE — Anesthesia Preprocedure Evaluation (Signed)
 Anesthesia Evaluation  Patient identified by MRN, date of birth, ID band Patient awake    Reviewed: Allergy & Precautions, H&P , NPO status , Patient's Chart, lab work & pertinent test results  Airway Mallampati: II  TM Distance: >3 FB Neck ROM: Full    Dental no notable dental hx.    Pulmonary neg pulmonary ROS, Current Smoker   Pulmonary exam normal breath sounds clear to auscultation       Cardiovascular negative cardio ROS Normal cardiovascular exam Rhythm:Regular Rate:Normal     Neuro/Psych negative neurological ROS  negative psych ROS   GI/Hepatic negative GI ROS, Neg liver ROS,,,  Endo/Other  negative endocrine ROS    Renal/GU negative Renal ROS  negative genitourinary   Musculoskeletal negative musculoskeletal ROS (+)    Abdominal   Peds negative pediatric ROS (+)  Hematology negative hematology ROS (+)   Anesthesia Other Findings Hx of cocaine abuse  Reproductive/Obstetrics negative OB ROS                              Anesthesia Physical Anesthesia Plan  ASA: 2  Anesthesia Plan: Regional and MAC   Post-op Pain Management: Regional block*   Induction: Intravenous  PONV Risk Score and Plan: Ondansetron and Propofol infusion  Airway Management Planned: Natural Airway and Simple Face Mask  Additional Equipment:   Intra-op Plan:   Post-operative Plan:   Informed Consent: I have reviewed the patients History and Physical, chart, labs and discussed the procedure including the risks, benefits and alternatives for the proposed anesthesia with the patient or authorized representative who has indicated his/her understanding and acceptance.     Dental advisory given  Plan Discussed with: CRNA  Anesthesia Plan Comments:          Anesthesia Quick Evaluation

## 2023-11-02 NOTE — Progress Notes (Signed)
 Per the OR desk, Dr. Frazier Butt called to inform that he would not be available for surgery until 1830 tonight, and if someone could inform the pt and it was okay if the pt did not want to wait. Informed that pt of the surgeon's arrival time, and the pt sts, "What do you mean he's not coming until 6:30pm, ya'll told me to be here by 2:45pm. That's lame! I'm leaving. Can you give me the number to the doctor to reschedule.?" Pt given the surgeon's office information, and the IV access was removed. OR desk made aware of the pt's decision to leave. Pt in no apparent distress with mild right hand pain.

## 2023-11-08 NOTE — Progress Notes (Signed)
 SDW call attempt.  Multiple attempts to call patient, the phone rings and then hangs up.  Was able to make contact with brother who said he will try to get him to call back.  Attempted to call patients brother back with surgery information and VM full.  Spoke with Aundra Millet, surgery scheduler at Dr. Georgia Duff office to make them aware patient may or may not show for surgery as unable to let him know time of arrival.  She was able to text patient with a call back number for SDW as well as arrival time, ERAS and no medications.  Unable to verify any history, allergies, medications etc with patient.

## 2023-11-09 ENCOUNTER — Encounter (HOSPITAL_COMMUNITY)
Admission: RE | Disposition: A | Discharge status: Home / Self Care | Payer: Self-pay | Source: Home / Self Care | Attending: Orthopedic Surgery

## 2023-11-09 ENCOUNTER — Other Ambulatory Visit: Payer: Self-pay

## 2023-11-09 ENCOUNTER — Ambulatory Visit (HOSPITAL_COMMUNITY): Payer: MEDICAID

## 2023-11-09 ENCOUNTER — Encounter (HOSPITAL_COMMUNITY): Payer: Self-pay | Admitting: Orthopedic Surgery

## 2023-11-09 ENCOUNTER — Ambulatory Visit (HOSPITAL_COMMUNITY)
Admission: RE | Admit: 2023-11-09 | Discharge: 2023-11-09 | Disposition: A | Discharge status: Home / Self Care | Payer: MEDICAID | Attending: Orthopedic Surgery | Admitting: Orthopedic Surgery

## 2023-11-09 DIAGNOSIS — F17210 Nicotine dependence, cigarettes, uncomplicated: Secondary | ICD-10-CM | POA: Diagnosis not present

## 2023-11-09 DIAGNOSIS — S62302A Unspecified fracture of third metacarpal bone, right hand, initial encounter for closed fracture: Secondary | ICD-10-CM

## 2023-11-09 DIAGNOSIS — S62322A Displaced fracture of shaft of third metacarpal bone, right hand, initial encounter for closed fracture: Secondary | ICD-10-CM | POA: Insufficient documentation

## 2023-11-09 DIAGNOSIS — F17290 Nicotine dependence, other tobacco product, uncomplicated: Secondary | ICD-10-CM | POA: Insufficient documentation

## 2023-11-09 HISTORY — PX: OPEN REDUCTION INTERNAL FIXATION (ORIF) METACARPAL: SHX6234

## 2023-11-09 SURGERY — OPEN REDUCTION INTERNAL FIXATION (ORIF) METACARPAL
Anesthesia: Monitor Anesthesia Care | Site: Finger | Laterality: Right

## 2023-11-09 MED ORDER — FENTANYL CITRATE (PF) 100 MCG/2ML IJ SOLN
INTRAMUSCULAR | Status: DC | PRN
  Administered 2023-11-09: 100 ug via INTRAVENOUS

## 2023-11-09 MED ORDER — 0.9 % SODIUM CHLORIDE (POUR BTL) OPTIME
TOPICAL | Status: DC | PRN
  Administered 2023-11-09: 1000 mL

## 2023-11-09 MED ORDER — FENTANYL CITRATE (PF) 100 MCG/2ML IJ SOLN
INTRAMUSCULAR | Status: AC
  Filled 2023-11-09: qty 2

## 2023-11-09 MED ORDER — HYDROMORPHONE HCL 1 MG/ML IJ SOLN: 0.2500 mg | INTRAMUSCULAR | Status: DC | PRN

## 2023-11-09 MED ORDER — ROPIVACAINE HCL 5 MG/ML IJ SOLN
INTRAMUSCULAR | Status: DC | PRN
  Administered 2023-11-09: 30 mL via PERINEURAL

## 2023-11-09 MED ORDER — SUCCINYLCHOLINE CHLORIDE 200 MG/10ML IV SOSY
PREFILLED_SYRINGE | INTRAVENOUS | Status: AC
  Filled 2023-11-09: qty 10

## 2023-11-09 MED ORDER — MIDAZOLAM HCL 2 MG/2ML IJ SOLN
INTRAMUSCULAR | Status: DC | PRN
  Administered 2023-11-09: 2 mg via INTRAVENOUS

## 2023-11-09 MED ORDER — ONDANSETRON HCL 4 MG/2ML IJ SOLN
INTRAMUSCULAR | Status: AC
  Filled 2023-11-09: qty 2

## 2023-11-09 MED ORDER — GLYCOPYRROLATE PF 0.2 MG/ML IJ SOSY
PREFILLED_SYRINGE | INTRAMUSCULAR | Status: AC
  Filled 2023-11-09: qty 1

## 2023-11-09 MED ORDER — LIDOCAINE 2% (20 MG/ML) 5 ML SYRINGE
INTRAMUSCULAR | Status: AC
  Filled 2023-11-09: qty 5

## 2023-11-09 MED ORDER — EPHEDRINE 5 MG/ML INJ
INTRAVENOUS | Status: AC
  Filled 2023-11-09: qty 5

## 2023-11-09 MED ORDER — PHENYLEPHRINE 80 MCG/ML (10ML) SYRINGE FOR IV PUSH (FOR BLOOD PRESSURE SUPPORT)
PREFILLED_SYRINGE | INTRAVENOUS | Status: AC
  Filled 2023-11-09: qty 10

## 2023-11-09 MED ORDER — CHLORHEXIDINE GLUCONATE 0.12 % MT SOLN
15.0000 mL | Freq: Once | OROMUCOSAL | Status: AC
  Administered 2023-11-09: 15 mL via OROMUCOSAL
  Filled 2023-11-09: qty 15

## 2023-11-09 MED ORDER — PROPOFOL 500 MG/50ML IV EMUL
INTRAVENOUS | Status: DC | PRN
  Administered 2023-11-09: 100 ug/kg/min via INTRAVENOUS
  Administered 2023-11-09: 30 ug via INTRAVENOUS

## 2023-11-09 MED ORDER — OXYCODONE HCL 5 MG PO TABS: 5.0000 mg | ORAL_TABLET | Freq: Once | ORAL | Status: DC | PRN

## 2023-11-09 MED ORDER — LIDOCAINE 2% (20 MG/ML) 5 ML SYRINGE
INTRAMUSCULAR | Status: DC | PRN
  Administered 2023-11-09: 20 mg via INTRAVENOUS

## 2023-11-09 MED ORDER — ROCURONIUM BROMIDE 10 MG/ML (PF) SYRINGE
PREFILLED_SYRINGE | INTRAVENOUS | Status: AC
  Filled 2023-11-09: qty 10

## 2023-11-09 MED ORDER — CEFAZOLIN SODIUM-DEXTROSE 2-4 GM/100ML-% IV SOLN
2.0000 g | INTRAVENOUS | Status: AC
  Administered 2023-11-09: 2 g via INTRAVENOUS
  Filled 2023-11-09: qty 100

## 2023-11-09 MED ORDER — PROPOFOL 10 MG/ML IV BOLUS
INTRAVENOUS | Status: AC
  Filled 2023-11-09: qty 20

## 2023-11-09 MED ORDER — LACTATED RINGERS IV SOLN: INTRAVENOUS | Status: DC

## 2023-11-09 MED ORDER — SODIUM CHLORIDE 0.9 % IV SOLN: 12.5000 mg | INTRAVENOUS | Status: DC | PRN

## 2023-11-09 MED ORDER — FENTANYL CITRATE (PF) 250 MCG/5ML IJ SOLN
INTRAMUSCULAR | Status: AC
  Filled 2023-11-09: qty 5

## 2023-11-09 MED ORDER — HYDROCODONE-ACETAMINOPHEN 5-325 MG PO TABS: 1.0000 | ORAL_TABLET | Freq: Four times a day (QID) | ORAL | 0 refills | Status: AC | PRN

## 2023-11-09 MED ORDER — OXYCODONE HCL 5 MG/5ML PO SOLN: 5.0000 mg | Freq: Once | ORAL | Status: DC | PRN

## 2023-11-09 MED ORDER — ONDANSETRON HCL 4 MG/2ML IJ SOLN
INTRAMUSCULAR | Status: DC | PRN
  Administered 2023-11-09: 4 mg via INTRAVENOUS

## 2023-11-09 MED ORDER — ARTIFICIAL TEARS OPHTHALMIC OINT
TOPICAL_OINTMENT | OPHTHALMIC | Status: AC
  Filled 2023-11-09: qty 3.5

## 2023-11-09 MED ORDER — DEXMEDETOMIDINE HCL IN NACL 400 MCG/100ML IV SOLN
INTRAVENOUS | Status: DC | PRN
  Administered 2023-11-09: 8 ug via INTRAVENOUS

## 2023-11-09 MED ORDER — MIDAZOLAM HCL 2 MG/2ML IJ SOLN
INTRAMUSCULAR | Status: AC
  Filled 2023-11-09: qty 2

## 2023-11-09 MED ORDER — TRAMADOL HCL 50 MG PO TABS: 50.0000 mg | ORAL_TABLET | Freq: Four times a day (QID) | ORAL | 0 refills | Status: DC | PRN

## 2023-11-09 MED ORDER — ORAL CARE MOUTH RINSE: 15.0000 mL | Freq: Once | OROMUCOSAL | Status: AC

## 2023-11-09 SURGICAL SUPPLY — 48 items
BAG COUNTER SPONGE SURGICOUNT (BAG) ×1 IMPLANT
BIT DRILL CANN SMOOTH 2.4X110 (DRILL) IMPLANT
BLADE CLIPPER SURG (BLADE) IMPLANT
BNDG ELASTIC 3INX 5YD STR LF (GAUZE/BANDAGES/DRESSINGS) ×1 IMPLANT
BNDG ELASTIC 4INX 5YD STR LF (GAUZE/BANDAGES/DRESSINGS) IMPLANT
BNDG ESMARK 4X9 LF (GAUZE/BANDAGES/DRESSINGS) ×1 IMPLANT
BNDG GAUZE DERMACEA FLUFF 4 (GAUZE/BANDAGES/DRESSINGS) ×1 IMPLANT
CORD BIPOLAR FORCEPS 12FT (ELECTRODE) ×1 IMPLANT
COVER SURGICAL LIGHT HANDLE (MISCELLANEOUS) ×1 IMPLANT
CUFF TOURN SGL QUICK 18X4 (TOURNIQUET CUFF) ×1 IMPLANT
CUFF TRNQT CYL 24X4X16.5-23 (TOURNIQUET CUFF) IMPLANT
DRAPE OEC MINIVIEW 54X84 (DRAPES) ×1 IMPLANT
DRAPE SURG 17X11 SM STRL (DRAPES) ×1 IMPLANT
DRILL CANN SMOOTH 2.4X110 (DRILL) ×1 IMPLANT
DRSG ADAPTIC 3X8 NADH LF (GAUZE/BANDAGES/DRESSINGS) ×1 IMPLANT
DRSG XEROFORM 1X8 (GAUZE/BANDAGES/DRESSINGS) IMPLANT
GAUZE 4X4 16PLY ~~LOC~~+RFID DBL (SPONGE) IMPLANT
GAUZE SPONGE 4X4 12PLY STRL (GAUZE/BANDAGES/DRESSINGS) ×1 IMPLANT
GLOVE BIOGEL PI IND STRL 8.5 (GLOVE) ×1 IMPLANT
GLOVE SURG ORTHO 8.0 STRL STRW (GLOVE) ×1 IMPLANT
GOWN STRL REUS W/ TWL LRG LVL3 (GOWN DISPOSABLE) ×3 IMPLANT
GOWN STRL REUS W/ TWL XL LVL3 (GOWN DISPOSABLE) ×1 IMPLANT
K-WIRE FIXATION TRI 1.1X120 (WIRE) ×1 IMPLANT
KIT BASIN OR (CUSTOM PROCEDURE TRAY) ×1 IMPLANT
KIT TURNOVER KIT B (KITS) ×1 IMPLANT
KWIRE FIXATION TRI 1.1X120 (WIRE) IMPLANT
MANIFOLD NEPTUNE II (INSTRUMENTS) ×1 IMPLANT
NAIL THREADED IM 2.4X55 TI IMPLANT
NDL HYPO 25X1 1.5 SAFETY (NEEDLE) ×1 IMPLANT
NEEDLE HYPO 25X1 1.5 SAFETY (NEEDLE) ×1 IMPLANT
NS IRRIG 1000ML POUR BTL (IV SOLUTION) ×1 IMPLANT
PACK ORTHO EXTREMITY (CUSTOM PROCEDURE TRAY) ×1 IMPLANT
PAD ARMBOARD POSITIONER FOAM (MISCELLANEOUS) ×2 IMPLANT
PAD CAST 4YDX4 CTTN HI CHSV (CAST SUPPLIES) ×1 IMPLANT
PADDING CAST ABS COTTON 4X4 ST (CAST SUPPLIES) IMPLANT
SOAP 2 % CHG 4 OZ (WOUND CARE) ×1 IMPLANT
SPLINT FIBERGLASS 4X30 (CAST SUPPLIES) IMPLANT
STRIP CLOSURE SKIN 1/2X4 (GAUZE/BANDAGES/DRESSINGS) IMPLANT
SUT ETHILON 4 0 PS 2 18 (SUTURE) IMPLANT
SUT MNCRL AB 4-0 PS2 18 (SUTURE) IMPLANT
SUT VIC AB 2-0 FS1 27 (SUTURE) IMPLANT
SUT VICRYL RAPIDE 4/0 PS 2 (SUTURE) IMPLANT
SYR CONTROL 10ML LL (SYRINGE) IMPLANT
TOWEL GREEN STERILE (TOWEL DISPOSABLE) ×1 IMPLANT
TOWEL GREEN STERILE FF (TOWEL DISPOSABLE) ×1 IMPLANT
TUBE CONNECTING 12X1/4 (SUCTIONS) ×1 IMPLANT
WATER STERILE IRR 1000ML POUR (IV SOLUTION) ×1 IMPLANT
YANKAUER SUCT BULB TIP NO VENT (SUCTIONS) IMPLANT
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
## 2023-11-09 NOTE — Interval H&P Note (Signed)
 History and Physical Interval Note:  11/09/2023 3:19 PM  Michael E Jacinto Reap.  has presented today for surgery, with the diagnosis of Right third metacarpal fracture.  The various methods of treatment have been discussed with the patient and family. After consideration of risks, benefits and other options for treatment, the patient has consented to  Procedure(s): OPEN REDUCTION INTERNAL FIXATION (ORIF) METACARPAL (Right) as a surgical intervention.  The patient's history has been reviewed, patient examined, no change in status, stable for surgery.  I have reviewed the patient's chart and labs.  Questions were answered to the patient's satisfaction.     Saliha Salts
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
## 2023-11-09 NOTE — Transfer of Care (Signed)
 Immediate Anesthesia Transfer of Care Note  Patient: Michael [name].  Procedure(s) Performed: OPEN REDUCTION INTERNAL FIXATION (ORIF) OR RIGHT THIRD METACARPAL SHAFT (Right: Finger)  Patient Location: PACU  Anesthesia Type:MAC combined with regional for post-op pain  Level of Consciousness: awake  Airway & Oxygen Therapy: Patient Spontanous Breathing  Post-op Assessment: Report given to RN and Post -op Vital signs reviewed and stable  Post vital signs: Reviewed and stable  Last Vitals:  Vitals Value Taken Time  BP 109/72 11/09/23 1648  Temp    Pulse 59 11/09/23 1651  Resp 9 11/09/23 1651  SpO2 99 % 11/09/23 1651  Vitals shown include unfiled device data.  Last Pain:  Vitals:   11/09/23 1310  PainSc: 0-No pain         Complications: No notable events documented.

## 2023-11-09 NOTE — Anesthesia Preprocedure Evaluation (Signed)
 Anesthesia Evaluation  Patient identified by MRN, date of birth, ID band Patient awake    Reviewed: Allergy & Precautions, H&P , NPO status , Patient's Chart, lab work & pertinent test results  Airway Mallampati: II  TM Distance: >3 FB Neck ROM: Full    Dental no notable dental hx.    Pulmonary neg pulmonary ROS, Current Smoker and Patient abstained from smoking.   Pulmonary exam normal breath sounds clear to auscultation       Cardiovascular negative cardio ROS Normal cardiovascular exam Rhythm:Regular Rate:Normal     Neuro/Psych negative neurological ROS  negative psych ROS   GI/Hepatic negative GI ROS, Neg liver ROS,,,  Endo/Other  negative endocrine ROS    Renal/GU negative Renal ROS  negative genitourinary   Musculoskeletal negative musculoskeletal ROS (+)    Abdominal   Peds negative pediatric ROS (+)  Hematology negative hematology ROS (+)   Anesthesia Other Findings Hx of cocaine abuse  Reproductive/Obstetrics negative OB ROS                             Anesthesia Physical Anesthesia Plan  ASA: 2  Anesthesia Plan: Regional and MAC   Post-op Pain Management: Regional block*   Induction: Intravenous  PONV Risk Score and Plan: Ondansetron and Propofol infusion  Airway Management Planned: Natural Airway and Simple Face Mask  Additional Equipment:   Intra-op Plan:   Post-operative Plan:   Informed Consent: I have reviewed the patients History and Physical, chart, labs and discussed the procedure including the risks, benefits and alternatives for the proposed anesthesia with the patient or authorized representative who has indicated his/her understanding and acceptance.     Dental advisory given  Plan Discussed with: CRNA  Anesthesia Plan Comments:         Anesthesia Quick Evaluation

## 2023-11-09 NOTE — Discharge Instructions (Signed)
 Waylan Rocher, M.D. Hand Surgery  POST-OPERATIVE DISCHARGE INSTRUCTIONS   PRESCRIPTIONS: You may have been given a prescription to be taken as directed for post-operative pain control.  You may also take over the counter ibuprofen/aleve and tylenol for pain. Take this as directed on the packaging. Do not exceed 3000 mg tylenol/acetaminophen in 24 hours.  Ibuprofen 600-800 mg (3-4) tablets by mouth every 6 hours as needed for pain.  OR Aleve 2 tablets by mouth every 12 hours (twice daily) as needed for pain.  AND/OR Tylenol 1000 mg (2 tablets) every 8 hours as needed for pain.  Please use your pain medication carefully, as refills are limited and you may not be provided with one.  As stated above, please use over the counter pain medicine - it will also be helpful with decreasing your swelling.    ANESTHESIA: After your surgery, post-surgical discomfort or pain is likely. This discomfort can last several days to a few weeks. At certain times of the day your discomfort may be more intense.   Did you receive a nerve block?  A nerve block can provide pain relief for one hour to two days after your surgery. As long as the nerve block is working, you will experience little or no sensation in the area the surgeon operated on.  As the nerve block wears off, you will begin to experience pain or discomfort. It is very important that you begin taking your prescribed pain medication before the nerve block fully wears off. Treating your pain at the first sign of the block wearing off will ensure your pain is better controlled and more tolerable when full-sensation returns. Do not wait until the pain is intolerable, as the medicine will be less effective. It is better to treat pain in advance than to try and catch up.   General Anesthesia:  If you did not receive a nerve block during your surgery, you will need to start taking your pain medication shortly after your surgery and should continue  to do so as prescribed by your surgeon.     ICE AND ELEVATION: You may use ice for the first 48-72 hours, but it is not critical.   Motion of your fingers is very important to decrease the swelling.  Elevation, as much as possible for the next 48 hours, is critical for decreasing swelling as well as for pain relief. Elevation means when you are seated or lying down, you hand should be at or above your heart. When walking, the hand needs to be at or above the level of your elbow.  If the bandage gets too tight, it may need to be loosened. Please contact our office and we will instruct you in how to do this.    SURGICAL BANDAGES:  Keep your dressing and/or splint clean and dry at all times.  Do not remove until you are seen again in the office.  If careful, you may place a plastic bag over your bandage and tape the end to shower, but be careful, do not get your bandages wet.     HAND THERAPY:  You may not need any. If you do, we will begin this at your follow up visit in the clinic.    ACTIVITY AND WORK: You are encouraged to move any fingers which are not in the bandage.  Light use of the fingers is allowed to assist the other hand with daily hygiene and eating, but strong gripping or lifting is often uncomfortable and  should be avoided.  You might miss a variable period of time from work and hopefully this issue has been discussed prior to surgery. You may not do any heavy work with your affected hand for about 2 weeks.    [facility] Second Floor, [address] [place], [place] [address] [phone]

## 2023-11-09 NOTE — Anesthesia Procedure Notes (Signed)
 Anesthesia Regional Block: Supraclavicular block   Pre-Anesthetic Checklist: , timeout performed,  Correct Patient, Correct Site, Correct Laterality,  Correct Procedure, Correct Position, site marked,  Risks and benefits discussed,  Surgical consent,  Pre-op evaluation,  At surgeon's request and post-op pain management  Laterality: Right  Prep: chloraprep       Needles:  Injection technique: Single-shot  Needle Type: Stimiplex     Needle Length: 9cm  Needle Gauge: 21     Additional Needles:   Procedures:,,,, ultrasound used (permanent image in chart),,    Narrative:  Start time: 11/09/2023 3:24 PM End time: 11/09/2023 3:29 PM Injection made incrementally with aspirations every 5 mL.  Performed by: Personally  Anesthesiologist: Lowella Curb, MD

## 2023-11-10 ENCOUNTER — Emergency Department (HOSPITAL_COMMUNITY)
Admission: EM | Admit: 2023-11-10 | Discharge: 2023-11-10 | Discharge status: Against Medical Advice | Payer: MEDICAID | Attending: Emergency Medicine | Admitting: Emergency Medicine

## 2023-11-10 ENCOUNTER — Encounter (HOSPITAL_COMMUNITY): Payer: Self-pay | Admitting: Emergency Medicine

## 2023-11-10 ENCOUNTER — Other Ambulatory Visit: Payer: Self-pay

## 2023-11-10 DIAGNOSIS — M79601 Pain in right arm: Secondary | ICD-10-CM | POA: Insufficient documentation

## 2023-11-10 DIAGNOSIS — G8918 Other acute postprocedural pain: Secondary | ICD-10-CM | POA: Insufficient documentation

## 2023-11-10 DIAGNOSIS — Z5321 Procedure and treatment not carried out due to patient leaving prior to being seen by health care provider: Secondary | ICD-10-CM | POA: Diagnosis not present

## 2023-11-10 NOTE — ED Notes (Signed)
 Patient left stated he was feeling  better.

## 2023-11-10 NOTE — Anesthesia Postprocedure Evaluation (Signed)
 Anesthesia Post Note  Patient: Michael [name].  Procedure(s) Performed: OPEN REDUCTION INTERNAL FIXATION (ORIF) OR RIGHT THIRD METACARPAL SHAFT (Right: Finger)     Patient location during evaluation: PACU Anesthesia Type: Regional and MAC Level of consciousness: awake and alert Pain management: pain level controlled Vital Signs Assessment: post-procedure vital signs reviewed and stable Respiratory status: spontaneous breathing, nonlabored ventilation, respiratory function stable and patient connected to nasal cannula oxygen Cardiovascular status: stable and blood pressure returned to baseline Postop Assessment: no apparent nausea or vomiting Anesthetic complications: no   No notable events documented.  Last Vitals:  Vitals:   11/09/23 1648 11/09/23 1700  BP: 109/72 (!) 140/94  Pulse: (!) 59 69  Resp: (!) 9 10  Temp: 36.4 C 36.4 C  SpO2: 99% 98%    Last Pain:  Vitals:   11/09/23 1700  PainSc: 0-No pain                 Earl Lites P Nakeitha Milligan

## 2023-11-10 NOTE — ED Triage Notes (Signed)
 Pt presents with post-surgical pain to right arm, pt reports surgery was yesterday and was unable to get his pain medication picked up, pt states he to 800mg  Ibuprofen x 4 tablets about 30 mins pta

## 2023-11-11 ENCOUNTER — Encounter (HOSPITAL_COMMUNITY): Payer: Self-pay | Admitting: Orthopedic Surgery

## 2024-08-10 ENCOUNTER — Emergency Department (HOSPITAL_COMMUNITY)
Admission: EM | Admit: 2024-08-10 | Discharge: 2024-08-10 | Disposition: A | Discharge status: Home / Self Care | Payer: MEDICAID | Attending: Emergency Medicine | Admitting: Emergency Medicine

## 2024-08-10 ENCOUNTER — Encounter (HOSPITAL_COMMUNITY): Payer: Self-pay

## 2024-08-10 ENCOUNTER — Emergency Department (HOSPITAL_COMMUNITY): Payer: MEDICAID

## 2024-08-10 DIAGNOSIS — D72829 Elevated white blood cell count, unspecified: Secondary | ICD-10-CM | POA: Diagnosis not present

## 2024-08-10 DIAGNOSIS — R0789 Other chest pain: Secondary | ICD-10-CM | POA: Insufficient documentation

## 2024-08-10 LAB — BASIC METABOLIC PANEL WITH GFR
Anion gap: 10 (ref 5–15)
BUN: 13 mg/dL (ref 6–20)
CO2: 23 mmol/L (ref 22–32)
Calcium: 9.2 mg/dL (ref 8.9–10.3)
Chloride: 103 mmol/L (ref 98–111)
Creatinine, Ser: 0.81 mg/dL (ref 0.61–1.24)
GFR, Estimated: >60 mL/min
Glucose, Bld: 90 mg/dL (ref 70–99)
Potassium: 3.7 mmol/L (ref 3.5–5.1)
Sodium: 136 mmol/L (ref 135–145)

## 2024-08-10 LAB — CBC
HCT: 39.4 % (ref 39.0–52.0)
Hemoglobin: 12.6 g/dL — ABNORMAL LOW (ref 13.0–17.0)
MCH: 27.9 pg (ref 26.0–34.0)
MCHC: 32 g/dL (ref 30.0–36.0)
MCV: 87.4 fL (ref 80.0–100.0)
Platelets: 325 K/uL (ref 150–400)
RBC: 4.51 MIL/uL (ref 4.22–5.81)
RDW: 14.1 % (ref 11.5–15.5)
WBC: 15 K/uL — ABNORMAL HIGH (ref 4.0–10.5)
nRBC: 0 % (ref 0.0–0.2)

## 2024-08-10 LAB — TROPONIN T, HIGH SENSITIVITY
Troponin T High Sensitivity: <15 ng/L (ref 0–19)
Troponin T High Sensitivity: <15 ng/L (ref 0–19)

## 2024-08-10 NOTE — Discharge Instructions (Signed)
 You were seen in the emergency department for your chest pain.  Your work-up showed no signs of heart attack, pneumonia, or fluid on your lungs.  It is unclear the cause of your chest pain at this time, but you can follow-up with your primary doctor to have your symptoms rechecked.  You should return to the emergency department if your pain significantly worsens, you have worsening shortness of breath, you pass out, or if you have any other new or concerning symptoms.

## 2024-08-10 NOTE — ED Notes (Signed)
 Pt constantly asking first look NT about wait time and says he's getting impatient; NT expressed to pt about the wait time and reassured that he will still receive care and we are working to get pt back as soon as possible.
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
## 2024-08-10 NOTE — ED Provider Notes (Signed)
 " Michael [name]   CSN: [id number] Arrival date & time: 08/10/24  1523     Patient presents with: Chest Pain   Michael [name]. is a 28 y.o. male.   Patient is a 28 year old male with past medical history of ecstasy use presenting to the emergency department with chest pain.  The patient states around 2:00 this afternoon he started to develop chest pain while walking.  He states it felt like a sharp stabbing pain on the left side of his chest and lasted for a few hours.  He states it did start after taking ecstasy.  He states that he was sleeping in the waiting room while waiting for room and since then his pain has significantly improved.  He denied any associated shortness of breath, nausea, vomiting or diaphoresis.  He denies any lower extremity swelling.  He denies any history of blood clots, any recent hospitalization or surgery, no recent long travel in the car plane, hormone use or cancer history.  The history is provided by the patient.  Chest Pain      Prior to Admission medications  Medication Sig Start Date End Date Taking? Authorizing Provider  ibuprofen  (ADVIL ) 800 MG tablet Take 1 tablet (800 mg total) by mouth 3 (three) times daily. Patient not taking: Reported on 10/27/2023 10/22/23   Rising, [place], PA-C    Allergies: Food and Shellfish protein-containing drug products    Review of Systems  Cardiovascular:  Positive for chest pain.    Updated Vital Signs BP 121/74   Pulse 61   Temp 98.1 F (36.7 C) (Oral)   Resp 16   Ht 5' 7 (1.702 m)   Wt 65 kg   SpO2 100%   BMI 22.44 kg/m   Physical Exam Vitals and nursing [name] reviewed.  Constitutional:      General: He is not in acute distress.    Appearance: He is well-developed.  HENT:     Head: Normocephalic.  Eyes:     Extraocular Movements: Extraocular movements intact.  Cardiovascular:     Rate and Rhythm: Normal rate and regular rhythm.      Pulses:          Radial pulses are 2+ on the right side and 2+ on the left side.     Heart sounds: Normal heart sounds.  Pulmonary:     Effort: Pulmonary effort is normal.     Breath sounds: Normal breath sounds.  Chest:     Chest wall: Tenderness (Left chest wall, no overlying skin changes) present.  Abdominal:     Palpations: Abdomen is soft.     Tenderness: There is no abdominal tenderness.  Musculoskeletal:        General: Normal range of motion.     Cervical back: Normal range of motion and neck supple.     Right lower leg: No edema.     Left lower leg: No edema.  Skin:    General: Skin is warm and dry.  Neurological:     General: No focal deficit present.     Mental Status: He is alert and oriented to person, place, and time.  Psychiatric:        Mood and Affect: Mood normal.        Behavior: Behavior normal.     (all labs ordered are listed, but only abnormal results are displayed) Labs Reviewed  CBC - Abnormal; Notable for the following components:  Result Value   WBC 15.0 (*)    Hemoglobin 12.6 (*)    All other components within normal limits  BASIC METABOLIC PANEL WITH GFR  TROPONIN T, HIGH SENSITIVITY  TROPONIN T, HIGH SENSITIVITY    EKG: EKG Interpretation Date/Time:  Friday August 10 2024 15:39:39 EST Ventricular Rate:  68 PR Interval:  131 QRS Duration:  84 QT Interval:  372 QTC Calculation: 396 R Axis:   32  Text Interpretation: Sinus rhythm Baseline wander in lead(s) I III aVL No significant change since last tracing Confirmed by Michael [name] ([id number]) on 08/10/2024 7:46:53 PM  Radiology: [facility] Chest 2 View Result Date: 08/10/2024 EXAM: 2 VIEW(S) XRAY OF THE CHEST 08/10/2024 04:49:43 PM COMPARISON: Chest x-ray dated 10/14/2021. CLINICAL HISTORY: chest pain FINDINGS: LUNGS AND PLEURA: Low lung volumes. No focal pulmonary opacity. No pleural effusion. No pneumothorax. HEART AND MEDIASTINUM: No acute abnormality of the cardiac and mediastinal  silhouettes. BONES AND SOFT TISSUES: No acute osseous abnormality. IMPRESSION: 1. No acute findings. Electronically signed by: Michael [name] 08/10/2024 05:41 PM EST RP Workstation: [id number]     Procedures   Medications Ordered in the ED - No data to display  Clinical Course as of 08/10/24 2059  [facility] Aug 10, 2024  2049 Repeat troponin negative. Patient is stable for discharge home with outpatient follow up. [VK]    Clinical Course User Index [VK] Michael [name], Michael Wirz [name], Michael [name]                                 Medical Decision Making This patient presents to the ED with chief complaint(s) of chest pain with pertinent past medical history of ecstasy use which further complicates the presenting complaint. The complaint involves an extensive differential diagnosis and also carries with it a high risk of complications and morbidity.    The differential diagnosis includes ACS, arrhythmia, anemia, pneumonia, pneumothorax, pulmonary edema, pleural effusion, gastritis, GERD, MSK pain, substance-induced, he is PERC negative making PE unlikely  Additional history obtained: Additional history obtained from N/A Records reviewed N/A  ED Course and Reassessment: On the patient's arrival he is hemodynamically stable in no acute distress.  Was initially evaluated in triage and had labs, EKG and chest x-ray performed.  EKG showed normal sinus rhythm without acute ischemic changes and initial troponin is negative.  He had mild leukocytosis but no infectious symptoms.  Chest x-ray is without acute disease.  No symptoms starting shortly prior to arrival will need repeat troponin and he will be closely reassessed.  Independent labs interpretation:  The following labs were independently interpreted: within normal range  Independent visualization of imaging: - I independently visualized the following imaging with scope of interpretation limited to determining acute life threatening conditions related to  emergency care: CXR, which revealed no acute disease  Consultation: - Consulted or discussed management/test interpretation w/ external professional: N/A  Consideration for admission or further workup: Patient has no emergent conditions requiring admission or further work-up at this time and is stable for discharge home with primary care follow-up  Social Determinants of health: No PCP, substance use    Amount and/or Complexity of Data Reviewed Labs: ordered. Radiology: ordered.       Final diagnoses:  Chest wall pain    ED Discharge Orders     None          Michael [name] POUR, Michael [name] 08/10/24 2059  "
